# Patient Record
Sex: Male | Born: 1935 | Race: Black or African American | Hispanic: No | Smoking: Never smoker
Health system: Southern US, Community
[De-identification: ages and names within clinical notes are randomized; demographics above are authoritative.]

## PROBLEM LIST (undated history)

## (undated) DIAGNOSIS — I4891 Unspecified atrial fibrillation: Secondary | ICD-10-CM

## (undated) DIAGNOSIS — I10 Essential (primary) hypertension: Secondary | ICD-10-CM

## (undated) HISTORY — DX: Unspecified atrial fibrillation: I48.91

---

## 2004-07-10 ENCOUNTER — Emergency Department (HOSPITAL_COMMUNITY): Admission: EM | Admit: 2004-07-10 | Discharge: 2004-07-10 | Payer: Self-pay | Admitting: Emergency Medicine

## 2010-10-15 ENCOUNTER — Inpatient Hospital Stay (HOSPITAL_COMMUNITY)
Admission: EM | Admit: 2010-10-15 | Discharge: 2010-10-21 | Payer: Self-pay | Source: Home / Self Care | Attending: Internal Medicine | Admitting: Internal Medicine

## 2010-10-18 HISTORY — PX: PROSTATE SURGERY: SHX751

## 2010-11-05 ENCOUNTER — Encounter: Payer: Self-pay | Admitting: Urology

## 2010-11-05 ENCOUNTER — Ambulatory Visit (HOSPITAL_COMMUNITY)
Admission: RE | Admit: 2010-11-05 | Discharge: 2010-11-07 | Payer: Self-pay | Source: Home / Self Care | Attending: Urology | Admitting: Urology

## 2010-11-09 LAB — BASIC METABOLIC PANEL
BUN: 22 mg/dL (ref 6–23)
BUN: 14 mg/dL (ref 6–23)
CO2: 25 mEq/L (ref 19–32)
CO2: 25 mEq/L (ref 19–32)
Calcium: 8.8 mg/dL (ref 8.4–10.5)
Calcium: 8.5 mg/dL (ref 8.4–10.5)
Chloride: 107 mEq/L (ref 96–112)
Chloride: 107 mEq/L (ref 96–112)
Creatinine, Ser: 1.44 mg/dL (ref 0.4–1.5)
Creatinine, Ser: 1.18 mg/dL (ref 0.4–1.5)
GFR calc Af Amer: 55 mL/min — ABNORMAL LOW (ref >60)
GFR calc Af Amer: >60 mL/min (ref >60)
GFR calc non Af Amer: 45 mL/min — ABNORMAL LOW (ref >60)
GFR calc non Af Amer: 57 mL/min — ABNORMAL LOW (ref >60)
Glucose, Bld: 99 mg/dL (ref 70–99)
Glucose, Bld: 102 mg/dL — ABNORMAL HIGH (ref 70–99)
Potassium: 4.7 mEq/L (ref 3.5–5.1)
Potassium: 4.4 mEq/L (ref 3.5–5.1)
Sodium: 138 mEq/L (ref 135–145)
Sodium: 136 mEq/L (ref 135–145)

## 2010-11-09 LAB — CBC
HCT: 31.1 % — ABNORMAL LOW (ref 39.0–52.0)
HCT: 28.1 % — ABNORMAL LOW (ref 39.0–52.0)
Hemoglobin: 10.7 g/dL — ABNORMAL LOW (ref 13.0–17.0)
Hemoglobin: 9.7 g/dL — ABNORMAL LOW (ref 13.0–17.0)
MCH: 33.3 pg (ref 26.0–34.0)
MCH: 32.7 pg (ref 26.0–34.0)
MCHC: 34.4 g/dL (ref 30.0–36.0)
MCHC: 34.5 g/dL (ref 30.0–36.0)
MCV: 96.9 fL (ref 78.0–100.0)
MCV: 94.6 fL (ref 78.0–100.0)
Platelets: 165 10*3/uL (ref 150–400)
Platelets: 148 10*3/uL — ABNORMAL LOW (ref 150–400)
RBC: 3.21 MIL/uL — ABNORMAL LOW (ref 4.22–5.81)
RBC: 2.97 MIL/uL — ABNORMAL LOW (ref 4.22–5.81)
RDW: 13 % (ref 11.5–15.5)
RDW: 12.9 % (ref 11.5–15.5)
WBC: 5.4 10*3/uL (ref 4.0–10.5)
WBC: 5.5 10*3/uL (ref 4.0–10.5)

## 2010-11-09 LAB — SURGICAL PCR SCREEN
MRSA, PCR: NEGATIVE
Staphylococcus aureus: NEGATIVE

## 2010-11-09 NOTE — Op Note (Signed)
NAME:  KRUZ, CHIU NO.:  [id number]  MEDICAL RECORD NO.:  [id number]          PATIENT TYPE:  OIB  LOCATION:  0098                         FACILITY:  [facility]  PHYSICIAN:  Excell Seltzer. Annabell Howells, M.D.    DATE OF BIRTH:  Jul 25, 1913  DATE OF PROCEDURE: DATE OF DISCHARGE:                              OPERATIVE REPORT   PROCEDURE:  Transurethral section of the prostate.  PREOPERATIVE DIAGNOSES: 1. Benign prostatic hyperplasia. 2. Bladder outlet obstruction. 3. Hematuria.  POSTOPERATIVE DIAGNOSIS: 1. Benign prostatic hyperplasia. 2. Bladder outlet obstruction. 3. Hematuria.  SURGEON:  Excell Seltzer. Annabell Howells, M.D.  ANESTHESIA:  General.  SPECIMEN:  Prostate tissue.  DRAINS:  A 22-French three-way Foley catheter.  ESTIMATED BLOOD LOSS:  200 mL.  COMPLICATIONS:  None.  INDICATIONS:  Mr. William Nicholson is a 77 year old, African-American male with a history of a TURP 35 years ago.  He recently presented in acute retention with hematuria.  He underwent fulguration of bleeders at a prior hospitalization, but has continued to have recurrent hematuria and office cystoscopy suggested the prostate as the source, although because of the bleeding, it was difficult to say for certain, but it was felt that cystoscopy with TUR and fulguration was indicated.  FINDINGS AND PROCEDURE:  He had been on Levaquin preoperatively.  He was taken to the operating room where he was given Cipro and fitted with PAS hose.  A spinal anesthetic was induced, and he was placed in the lithotomy position.  His perineum and genitalia were prepped with Betadine solution.  He was draped in the usual sterile fashion. Cystoscopy was performed using a 22-French scope and 12-degree lens. Examination revealed a normal urethra.  The external sphincter was intact.  The prostatic urethra had lateral lobe enlargement with some apical scarring.  There was evidence of a TUR defect proximally. No middle lobe was noted.  The  bladder had moderate trabeculation with some wide-mouth diverticula.  No tumors or stones were noted, but there was some catheter irritation on the bladder wall.  The ureteral orifices were unremarkable.  A 28-French continuous flow resectoscope sheath was then inserted. Saline was used for the irrigant.  This was fitted with an [place] handle, a loop, electrode and the 12-degree lens.  Resection of the prostate was initiated on the floor of the prostate out and alongside the verumontanum.  The patient had brisk bleeding in many areas that required generous fulguration.  Once the floor identified, the right lateral lobe was resected from bladder neck to apex out the capsular fibers.  At one point during this resection, I encountered an arterial bleeder that could not be controlled with the loop, so the button electrode was placed, and this bleeder was successfully fulgurated.  The loop electrode was then replaced, and the resection of the right lobe was completed.  This was followed by resection of the left lobe from bladder neck to apex out to the capsular fibers.  A similar bleeder was encountered on the left that required the button electrode for fulguration.  Once the button electrode was in the second time, I used it to vaporize some residual apical  and anterior tissue and ensure good hemostasis by a generous fulguration of the resected areas.  At this point, the prostatic urethra was widely patent.  The bladder was evacuated free of chips, and final hemostasis was achieved.  The resectoscope was removed, and a 22-French three-way Foley catheter was inserted.  The balloon was filled with 30 mL of sterile fluid.  The catheter was hand-irrigated with clear return and placed on continuous irrigation and straight drainage.  The patient was taken down from the lithotomy position, and his anesthetic was reversed.  He was moved to the recovery room in stable condition.  There were no  complications.     Excell Seltzer. Annabell Howells, M.D.     JJW/MEDQ  D:  11/05/2010  T:  11/05/2010  Job:  [id number]  Electronically Signed by Bjorn Pippin M.D. on 11/09/2010 08:15:30 AM

## 2010-12-28 LAB — CBC
HCT: 21.4 % — ABNORMAL LOW (ref 39.0–52.0)
HCT: 22.3 % — ABNORMAL LOW (ref 39.0–52.0)
HCT: 22.5 % — ABNORMAL LOW (ref 39.0–52.0)
HCT: 25.4 % — ABNORMAL LOW (ref 39.0–52.0)
HCT: 26.8 % — ABNORMAL LOW (ref 39.0–52.0)
HCT: 27.3 % — ABNORMAL LOW (ref 39.0–52.0)
HCT: 33 % — ABNORMAL LOW (ref 39.0–52.0)
Hemoglobin: 11.5 g/dL — ABNORMAL LOW (ref 13.0–17.0)
Hemoglobin: 7.3 g/dL — ABNORMAL LOW (ref 13.0–17.0)
Hemoglobin: 7.6 g/dL — ABNORMAL LOW (ref 13.0–17.0)
Hemoglobin: 7.7 g/dL — ABNORMAL LOW (ref 13.0–17.0)
Hemoglobin: 8.9 g/dL — ABNORMAL LOW (ref 13.0–17.0)
MCH: 32.8 pg (ref 26.0–34.0)
MCH: 33.6 pg (ref 26.0–34.0)
MCH: 33.8 pg (ref 26.0–34.0)
MCH: 33.8 pg (ref 26.0–34.0)
MCH: 33.8 pg (ref 26.0–34.0)
MCH: 34.1 pg — ABNORMAL HIGH (ref 26.0–34.0)
MCH: 34.4 pg — ABNORMAL HIGH (ref 26.0–34.0)
MCHC: 34.1 g/dL (ref 30.0–36.0)
MCHC: 34.1 g/dL (ref 30.0–36.0)
MCHC: 34.2 g/dL (ref 30.0–36.0)
MCHC: 34.4 g/dL (ref 30.0–36.0)
MCHC: 34.8 g/dL (ref 30.0–36.0)
MCHC: 35 g/dL (ref 30.0–36.0)
MCV: 100 fL (ref 78.0–100.0)
MCV: 100.4 fL — ABNORMAL HIGH (ref 78.0–100.0)
MCV: 94.7 fL (ref 78.0–100.0)
MCV: 95.1 fL (ref 78.0–100.0)
MCV: 96.6 fL (ref 78.0–100.0)
MCV: 97.1 fL (ref 78.0–100.0)
MCV: 99.1 fL (ref 78.0–100.0)
Platelets: 107 10*3/uL — ABNORMAL LOW (ref 150–400)
Platelets: 113 10*3/uL — ABNORMAL LOW (ref 150–400)
Platelets: 121 10*3/uL — ABNORMAL LOW (ref 150–400)
Platelets: 121 10*3/uL — ABNORMAL LOW (ref 150–400)
Platelets: 124 10*3/uL — ABNORMAL LOW (ref 150–400)
Platelets: 134 10*3/uL — ABNORMAL LOW (ref 150–400)
Platelets: 162 10*3/uL (ref 150–400)
RBC: 2.14 MIL/uL — ABNORMAL LOW (ref 4.22–5.81)
RBC: 2.24 MIL/uL — ABNORMAL LOW (ref 4.22–5.81)
RBC: 2.25 MIL/uL — ABNORMAL LOW (ref 4.22–5.81)
RBC: 2.63 MIL/uL — ABNORMAL LOW (ref 4.22–5.81)
RBC: 2.83 MIL/uL — ABNORMAL LOW (ref 4.22–5.81)
RBC: 3.4 MIL/uL — ABNORMAL LOW (ref 4.22–5.81)
RDW: 11.7 % (ref 11.5–15.5)
RDW: 11.7 % (ref 11.5–15.5)
RDW: 12 % (ref 11.5–15.5)
RDW: 12 % (ref 11.5–15.5)
RDW: 12 % (ref 11.5–15.5)
RDW: 13.9 % (ref 11.5–15.5)
WBC: 5.4 10*3/uL (ref 4.0–10.5)
WBC: 5.9 10*3/uL (ref 4.0–10.5)
WBC: 7.2 10*3/uL (ref 4.0–10.5)
WBC: 7.5 10*3/uL (ref 4.0–10.5)
WBC: 8.6 10*3/uL (ref 4.0–10.5)
WBC: 9 10*3/uL (ref 4.0–10.5)
WBC: 9.6 10*3/uL (ref 4.0–10.5)

## 2010-12-28 LAB — URINALYSIS, ROUTINE W REFLEX MICROSCOPIC
Bilirubin Urine: NEGATIVE
Glucose, UA: 100 mg/dL — AB
Nitrite: POSITIVE — AB
Protein, ur: >300 mg/dL — AB
Specific Gravity, Urine: 1.021 (ref 1.005–1.030)
Urobilinogen, UA: 0.2 mg/dL (ref 0.0–1.0)
pH: 7 (ref 5.0–8.0)

## 2010-12-28 LAB — BASIC METABOLIC PANEL
BUN: 10 mg/dL (ref 6–23)
BUN: 15 mg/dL (ref 6–23)
BUN: 77 mg/dL — ABNORMAL HIGH (ref 6–23)
BUN: 98 mg/dL — ABNORMAL HIGH (ref 6–23)
CO2: 20 mEq/L (ref 19–32)
CO2: 22 mEq/L (ref 19–32)
CO2: 22 mEq/L (ref 19–32)
CO2: 22 mEq/L (ref 19–32)
CO2: 25 mEq/L (ref 19–32)
Calcium: 7.5 mg/dL — ABNORMAL LOW (ref 8.4–10.5)
Calcium: 8.1 mg/dL — ABNORMAL LOW (ref 8.4–10.5)
Calcium: 8.4 mg/dL (ref 8.4–10.5)
Chloride: 107 mEq/L (ref 96–112)
Chloride: 108 mEq/L (ref 96–112)
Chloride: 110 mEq/L (ref 96–112)
Chloride: 113 mEq/L — ABNORMAL HIGH (ref 96–112)
Chloride: 94 mEq/L — ABNORMAL LOW (ref 96–112)
Creatinine, Ser: 1.16 mg/dL (ref 0.4–1.5)
Creatinine, Ser: 1.3 mg/dL (ref 0.4–1.5)
Creatinine, Ser: 1.48 mg/dL (ref 0.4–1.5)
Creatinine, Ser: 11.68 mg/dL — ABNORMAL HIGH (ref 0.4–1.5)
Creatinine, Ser: 7.35 mg/dL — ABNORMAL HIGH (ref 0.4–1.5)
GFR calc Af Amer: 5 mL/min — ABNORMAL LOW (ref >60)
GFR calc Af Amer: 53 mL/min — ABNORMAL LOW (ref >60)
GFR calc Af Amer: 8 mL/min — ABNORMAL LOW (ref >60)
GFR calc Af Amer: >60 mL/min (ref >60)
GFR calc non Af Amer: 4 mL/min — ABNORMAL LOW (ref >60)
GFR calc non Af Amer: 44 mL/min — ABNORMAL LOW (ref >60)
GFR calc non Af Amer: 7 mL/min — ABNORMAL LOW (ref >60)
Glucose, Bld: 104 mg/dL — ABNORMAL HIGH (ref 70–99)
Glucose, Bld: 108 mg/dL — ABNORMAL HIGH (ref 70–99)
Glucose, Bld: 109 mg/dL — ABNORMAL HIGH (ref 70–99)
Glucose, Bld: 91 mg/dL (ref 70–99)
Potassium: 3.7 mEq/L (ref 3.5–5.1)
Potassium: 3.8 mEq/L (ref 3.5–5.1)
Potassium: 4 mEq/L (ref 3.5–5.1)
Potassium: 5.2 mEq/L — ABNORMAL HIGH (ref 3.5–5.1)
Potassium: 5.4 mEq/L — ABNORMAL HIGH (ref 3.5–5.1)
Sodium: 127 mEq/L — ABNORMAL LOW (ref 135–145)
Sodium: 139 mEq/L (ref 135–145)
Sodium: 139 mEq/L (ref 135–145)

## 2010-12-28 LAB — DIFFERENTIAL
Basophils Absolute: 0 10*3/uL (ref 0.0–0.1)
Basophils Relative: 0 % (ref 0–1)
Eosinophils Absolute: 0.2 10*3/uL (ref 0.0–0.7)
Eosinophils Relative: 4 % (ref 0–5)
Lymphocytes Relative: 18 % (ref 12–46)
Lymphs Abs: 1.1 10*3/uL (ref 0.7–4.0)
Monocytes Absolute: 0.8 10*3/uL (ref 0.1–1.0)
Monocytes Relative: 14 % — ABNORMAL HIGH (ref 3–12)
Neutro Abs: 3.8 10*3/uL (ref 1.7–7.7)
Neutrophils Relative %: 64 % (ref 43–77)

## 2010-12-28 LAB — RENAL FUNCTION PANEL
Albumin: 2.4 g/dL — ABNORMAL LOW (ref 3.5–5.2)
Albumin: 2.6 g/dL — ABNORMAL LOW (ref 3.5–5.2)
BUN: 29 mg/dL — ABNORMAL HIGH (ref 6–23)
BUN: 52 mg/dL — ABNORMAL HIGH (ref 6–23)
CO2: 22 mEq/L (ref 19–32)
CO2: 23 mEq/L (ref 19–32)
Calcium: 7.6 mg/dL — ABNORMAL LOW (ref 8.4–10.5)
Calcium: 7.8 mg/dL — ABNORMAL LOW (ref 8.4–10.5)
Chloride: 113 mEq/L — ABNORMAL HIGH (ref 96–112)
Chloride: 113 mEq/L — ABNORMAL HIGH (ref 96–112)
Creatinine, Ser: 2.44 mg/dL — ABNORMAL HIGH (ref 0.4–1.5)
Creatinine, Ser: 4.25 mg/dL — ABNORMAL HIGH (ref 0.4–1.5)
GFR calc Af Amer: 16 mL/min — ABNORMAL LOW (ref >60)
GFR calc Af Amer: 30 mL/min — ABNORMAL LOW (ref >60)
GFR calc non Af Amer: 13 mL/min — ABNORMAL LOW (ref >60)
GFR calc non Af Amer: 25 mL/min — ABNORMAL LOW (ref >60)
Glucose, Bld: 112 mg/dL — ABNORMAL HIGH (ref 70–99)
Glucose, Bld: 153 mg/dL — ABNORMAL HIGH (ref 70–99)
Phosphorus: 2.8 mg/dL (ref 2.3–4.6)
Phosphorus: 3.4 mg/dL (ref 2.3–4.6)
Potassium: 4 mEq/L (ref 3.5–5.1)
Potassium: 4.4 mEq/L (ref 3.5–5.1)
Sodium: 141 mEq/L (ref 135–145)
Sodium: 141 mEq/L (ref 135–145)

## 2010-12-28 LAB — CULTURE, BLOOD (ROUTINE X 2)
Culture  Setup Time: 201112301107
Culture  Setup Time: 201112301108
Culture: NO GROWTH
Culture: NO GROWTH

## 2010-12-28 LAB — CROSSMATCH
ABO/RH(D): O POS
Unit division: 0

## 2010-12-28 LAB — PSA: PSA: 38.26 ng/mL — ABNORMAL HIGH (ref <=4.00)

## 2010-12-28 LAB — URINE CULTURE
Colony Count: NO GROWTH
Culture  Setup Time: 201112300224
Culture: NO GROWTH

## 2010-12-28 LAB — URINE MICROSCOPIC-ADD ON

## 2010-12-28 LAB — ABO/RH: ABO/RH(D): O POS

## 2014-11-25 ENCOUNTER — Emergency Department (HOSPITAL_COMMUNITY)
Admission: EM | Admit: 2014-11-25 | Discharge: 2014-11-25 | Disposition: A | Discharge status: Home / Self Care | Payer: Commercial Managed Care - HMO | Attending: Emergency Medicine | Admitting: Emergency Medicine

## 2014-11-25 ENCOUNTER — Encounter (HOSPITAL_COMMUNITY): Payer: Self-pay

## 2014-11-25 ENCOUNTER — Emergency Department (HOSPITAL_COMMUNITY): Payer: Commercial Managed Care - HMO

## 2014-11-25 DIAGNOSIS — Y998 Other external cause status: Secondary | ICD-10-CM | POA: Insufficient documentation

## 2014-11-25 DIAGNOSIS — Y92008 Other place in unspecified non-institutional (private) residence as the place of occurrence of the external cause: Secondary | ICD-10-CM | POA: Diagnosis not present

## 2014-11-25 DIAGNOSIS — I10 Essential (primary) hypertension: Secondary | ICD-10-CM | POA: Diagnosis not present

## 2014-11-25 DIAGNOSIS — Y9389 Activity, other specified: Secondary | ICD-10-CM | POA: Diagnosis not present

## 2014-11-25 DIAGNOSIS — S8991XA Unspecified injury of right lower leg, initial encounter: Secondary | ICD-10-CM | POA: Diagnosis not present

## 2014-11-25 DIAGNOSIS — M1711 Unilateral primary osteoarthritis, right knee: Secondary | ICD-10-CM | POA: Diagnosis not present

## 2014-11-25 DIAGNOSIS — M25561 Pain in right knee: Secondary | ICD-10-CM | POA: Diagnosis not present

## 2014-11-25 HISTORY — DX: Essential (primary) hypertension: I10

## 2014-11-25 MED ORDER — ACETAMINOPHEN 325 MG PO TABS
650.0000 mg | ORAL_TABLET | Freq: Once | ORAL | Status: AC
  Administered 2014-11-25: 650 mg via ORAL
  Filled 2014-11-25: qty 2

## 2014-11-25 NOTE — Progress Notes (Signed)
CSW met with patient at bedside. Family was present. Family confirms that the patient presents to [facility] due to a fall that occurred yesterday. Family says that patient fell off of a chair. Daughter, states that she lives in the home with patient and the two stay in [place]. According to daughter, the patient has been able to complete his ADL's independently.  Daughter states that although the patient has been completing his ADL's independently she feels as though he has been moving slower than normal the last couple of weeks. Daughter states that she and patient do not want extra assistance at home during this time. She stated she wants to wait and see what a physician has to say and if patient will improve.   Family does not have any questions at this time.  Patient appears to be extremely hard of hearing.   Romie Minus Shaffner [phone] Omelia Blackwater [phone] ED CSW 11/25/2014 10:24 PM

## 2014-11-25 NOTE — ED Notes (Signed)
Family states that patient slid out of a chair that he gets around in onto his bottom, pt only complains of right knee pain

## 2014-11-25 NOTE — Discharge Instructions (Signed)
Arthralgia Arthralgia is joint pain. A joint is a place where two bones meet. Joint pain can happen for many reasons. The joint can be bruised, stiff, infected, or weak from aging. Pain usually goes away after resting and taking medicine for soreness.  HOME CARE  Rest the joint as told by your doctor.  Keep the sore joint raised (elevated) for the first 24 hours.  Put ice on the joint area.  Put ice in a plastic bag.  Place a towel between your skin and the bag.  Leave the ice on for 15-20 minutes, 03-04 times a day.  Wear your splint, casting, elastic bandage, or sling as told by your doctor.  Only take medicine as told by your doctor. Do not take aspirin.  Use crutches as told by your doctor. Do not put weight on the joint until told to by your doctor. GET HELP RIGHT AWAY IF:   You have bruising, puffiness (swelling), or more pain.  Your fingers or toes turn blue or start to lose feeling (numb).  Your medicine does not lessen the pain.  Your pain becomes severe.  You have a temperature by mouth above 102 F (38.9 C), not controlled by medicine.  You cannot move or use the joint. MAKE SURE YOU:   Understand these instructions.  Will watch your condition.  Will get help right away if you are not doing well or get worse. Document Released: 09/22/2009 Document Revised: 12/27/2011 Document Reviewed: 09/22/2009 [facility] Patient Information 2015 [employer] [place]. This information is not intended to replace advice given to you by your health care provider. Make sure you discuss any questions you have with your health care provider. You can safely give your dad Tylenol or Advil for discomfort.  He can wear the knee sleeve for comfort for the next several days.  He should rest as much as possible for the next one to 2 days, but still get up and ambulate to the restroom with assistance.  Then slowly as he resume normal activities

## 2014-11-25 NOTE — ED Provider Notes (Signed)
CSN: [id number]     Arrival date & time 11/25/14  2038 History  This chart was scribed for non-physician practitioner, Sabino Dick, NP-C working with Purvis Sheffield, MD by Luisa Dago, ED scribe. This patient was seen in room WTR7/WTR7 and the patient's care was started at 9:54 PM.    Chief Complaint  Patient presents with  . Knee Pain   The history is provided by the patient and medical records. No language interpreter was used.   HPI Comments: William Nicholson is a 81 y.o. male with PMhx of HTN presents to the Emergency Department complaining of a fall that occurred last night. Family states that they heard the pt fall to the floor from motor chair, but the incident was not witnessed by them. Daughter states that she noticed that the pt was moving slow today, however, after he woke up from his nap he had a hard time walking to the bathroom. She states that she had to run and catch him to sit him in his chair. After asking him if he was in any pain, pt admitted to having pain in his right knee. No medicine was given to the pt for pain. Denies any chest pain or back pain.   Past Medical History  Diagnosis Date  . Hypertension    History reviewed. No pertinent past surgical history. History reviewed. No pertinent family history. History  Substance Use Topics  . Smoking status: Never Smoker   . Smokeless tobacco: Not on file  . Alcohol Use: No    Review of Systems  Constitutional: Negative for fever.  Musculoskeletal: Positive for arthralgias. Negative for myalgias and joint swelling.  Neurological: Negative for weakness and numbness.   Allergies  Review of patient's allergies indicates no known allergies.  Home Medications   Prior to Admission medications   Medication Sig Start Date End Date Taking? Authorizing Provider  metoprolol succinate (TOPROL-XL) 100 MG 24 hr tablet Take 100 mg by mouth daily. 11/09/14  Yes Historical Provider, MD  Rivaroxaban (XARELTO) 15 MG TABS tablet  Take 15 mg by mouth daily.   Yes Historical Provider, MD   BP 116/80 mmHg  Pulse 99  Temp(Src) 97.9 F (36.6 C) (Oral)  Resp 16  SpO2 97%  Physical Exam  Constitutional: He is oriented to person, place, and time. He appears well-developed and well-nourished.  HENT:  Head: Normocephalic and atraumatic.  Cardiovascular: Normal rate.   Pulmonary/Chest: Effort normal.  Abdominal: He exhibits no distension.  Neurological: He is alert and oriented to person, place, and time.  Skin: Skin is warm and dry.  Psychiatric: He has a normal mood and affect.  Nursing note and vitals reviewed.   ED Course  Procedures (including critical care time)  DIAGNOSTIC STUDIES: Oxygen Saturation is 97% on RA, adequate by my interpretation.    COORDINATION OF CARE: 10:00 PM- Pt advised of plan for treatment and pt agrees.  Labs Review Labs Reviewed - No data to display  Imaging Review Dg Knee Complete 4 Views Right  11/25/2014   CLINICAL DATA:  Patient's lead off of the tear yesterday. Complaining of right knee pain. Difficulty weight-bearing.  EXAM: RIGHT KNEE - COMPLETE 4+ VIEW  COMPARISON:  None.  FINDINGS: Degenerative changes in the right knee with compartment narrowing and osteophytosis greatest in the lateral compartment. No evidence of acute fracture or subluxation. No focal bone lesion or bone destruction. Bone cortex and trabecular architecture appear intact. No significant effusion. No radiopaque soft tissue foreign bodies. Vascular  calcifications.  IMPRESSION: Degenerative changes in the right knee. No acute bony abnormalities.   Electronically Signed   By: Burman NievesWilliam  Stevens M.D.   On: 11/25/2014 21:53     EKG Interpretation None     X-ray shows mild degenerative changes but no fracture or subluxation.  Patient has been given Tylenol for his discomfort, as well as placed in a knee sleeve.  He's been instructed to rest as much as possible for the next several days and then resume normal  activities MDM   Final diagnoses:  Knee pain, acute, right   I personally performed the services described in this documentation, which was scribed in my presence. The recorded information has been reviewed and is accurate.   Arman FilterGail K Terrisha Lopata, NP 11/25/14 [id number]  Purvis SheffieldForrest Harrison, MD 11/26/14 [id number]

## 2014-12-17 DIAGNOSIS — S99921A Unspecified injury of right foot, initial encounter: Secondary | ICD-10-CM | POA: Diagnosis not present

## 2014-12-17 DIAGNOSIS — I10 Essential (primary) hypertension: Secondary | ICD-10-CM | POA: Diagnosis not present

## 2014-12-17 DIAGNOSIS — I482 Chronic atrial fibrillation: Secondary | ICD-10-CM | POA: Diagnosis not present

## 2014-12-17 DIAGNOSIS — M79671 Pain in right foot: Secondary | ICD-10-CM | POA: Diagnosis not present

## 2014-12-17 DIAGNOSIS — R600 Localized edema: Secondary | ICD-10-CM | POA: Diagnosis not present

## 2014-12-24 DIAGNOSIS — I10 Essential (primary) hypertension: Secondary | ICD-10-CM | POA: Diagnosis not present

## 2014-12-24 DIAGNOSIS — I482 Chronic atrial fibrillation: Secondary | ICD-10-CM | POA: Diagnosis not present

## 2014-12-24 DIAGNOSIS — R600 Localized edema: Secondary | ICD-10-CM | POA: Diagnosis not present

## 2015-01-03 ENCOUNTER — Ambulatory Visit (INDEPENDENT_AMBULATORY_CARE_PROVIDER_SITE_OTHER): Payer: Commercial Managed Care - HMO

## 2015-01-03 ENCOUNTER — Ambulatory Visit (INDEPENDENT_AMBULATORY_CARE_PROVIDER_SITE_OTHER): Payer: Commercial Managed Care - HMO | Admitting: Podiatry

## 2015-01-03 ENCOUNTER — Encounter: Payer: Self-pay | Admitting: Podiatry

## 2015-01-03 VITALS — BP 152/93 | HR 82 | Resp 15

## 2015-01-03 DIAGNOSIS — M79671 Pain in right foot: Secondary | ICD-10-CM

## 2015-01-03 DIAGNOSIS — B351 Tinea unguium: Secondary | ICD-10-CM

## 2015-01-03 DIAGNOSIS — M79676 Pain in unspecified toe(s): Secondary | ICD-10-CM | POA: Diagnosis not present

## 2015-01-03 DIAGNOSIS — R609 Edema, unspecified: Secondary | ICD-10-CM

## 2015-01-03 NOTE — Progress Notes (Signed)
   Subjective:    Patient ID: William Nicholson, male    DOB: 1913/09/28, 81 y.o.   MRN: [id number]  HPI 81 year old male presents the office today with family member with complaints of thick, discolored, elongated toenails which she is unable to trim himself. They deny any recent redness or drainage from the nail sites. Then a number also states that he's had some swelling to his feet particularly his right foot. He does have a history of injury to the right foot several weeks ago for which he has seen another physician which x-rays were obtained and he was told no fracture was present. His diuretic has been increased. No other complaints at this time.   Review of Systems  All other systems reviewed and are negative.      Objective:   Physical Exam Awake, alert, NAD DP/PT pulses decreased bilaterally, CRT less than 3 seconds Achilles tendon reflex intact. Difficult to evaluate protective sensation vibratory sensation Nails are hypertrophic, dystrophic, elongated, brittle, discolored 10. No surrounding erythema or drainage. There subjective tenderness on nails 1-5 bilaterally. No open lesions or pre-ulcerative lesions are identified bilaterally. No interdigital maceration. There is mild edema to the right dorsal foot. There is not appear to be any area pinpoint bony tenderness or pain with vibratory sensation. There is no overlying erythema or increase in warmth. There is no apparent discomfort ankles, subtalar, MTPJ, midfoot joint range of motion. No pain with calf compression, swelling, warmth, erythema.       Assessment & Plan:  81 year old male presents for symptomatically onychomycosis, right foot swelling -X-rays were obtained and discussed with the patient/family number. There is no evidence of degenerative fracture at this time. Continue follow-up with primary care physician. -Nail sharply debrided 10 without complication/bleeding. -Discussed importance of daily foot  inspection. -Follow-up in 3 months or sooner if needed.

## 2015-01-07 ENCOUNTER — Encounter: Payer: Self-pay | Admitting: Podiatry

## 2015-02-19 DIAGNOSIS — N401 Enlarged prostate with lower urinary tract symptoms: Secondary | ICD-10-CM | POA: Diagnosis not present

## 2015-02-19 DIAGNOSIS — N138 Other obstructive and reflux uropathy: Secondary | ICD-10-CM | POA: Diagnosis not present

## 2015-02-19 DIAGNOSIS — N390 Urinary tract infection, site not specified: Secondary | ICD-10-CM | POA: Diagnosis not present

## 2015-02-27 DIAGNOSIS — R310 Gross hematuria: Secondary | ICD-10-CM | POA: Diagnosis not present

## 2015-02-27 DIAGNOSIS — N390 Urinary tract infection, site not specified: Secondary | ICD-10-CM | POA: Diagnosis not present

## 2015-03-11 DIAGNOSIS — I10 Essential (primary) hypertension: Secondary | ICD-10-CM | POA: Diagnosis not present

## 2015-03-11 DIAGNOSIS — I482 Chronic atrial fibrillation: Secondary | ICD-10-CM | POA: Diagnosis not present

## 2015-03-18 DIAGNOSIS — I10 Essential (primary) hypertension: Secondary | ICD-10-CM | POA: Diagnosis not present

## 2015-03-18 DIAGNOSIS — D696 Thrombocytopenia, unspecified: Secondary | ICD-10-CM | POA: Diagnosis not present

## 2015-03-18 DIAGNOSIS — I482 Chronic atrial fibrillation: Secondary | ICD-10-CM | POA: Diagnosis not present

## 2015-04-04 ENCOUNTER — Ambulatory Visit (INDEPENDENT_AMBULATORY_CARE_PROVIDER_SITE_OTHER): Payer: Commercial Managed Care - HMO | Admitting: Podiatry

## 2015-04-04 DIAGNOSIS — B351 Tinea unguium: Secondary | ICD-10-CM

## 2015-04-04 DIAGNOSIS — M79676 Pain in unspecified toe(s): Secondary | ICD-10-CM

## 2015-04-04 DIAGNOSIS — L84 Corns and callosities: Secondary | ICD-10-CM

## 2015-04-12 NOTE — Progress Notes (Signed)
Patient ID: William Nicholson, male   DOB: [birth year], 81 y.o.   MRN: [id number]  Subjective: 81 y.o.-year-old male returns the office today for painful, elongated, thickened toenails which he is unable to trim himself. Denies any redness or drainage around the nails. Denies any acute changes since last appointment and no new complaints today. Denies any systemic complaints such as fevers, chills, nausea, vomiting.   Objective: AAO 3, NAD DP/PT pulses decreased, CRT less than 3 seconds Achilles tendon reflex intact.  Nails hypertrophic, dystrophic, elongated, brittle, discolored 10. There is tenderness overlying the nails 1-5 bilatereally. There is no surrounding erythema or drainage along the nail sites.  on the lateral aspect of the right fifth metatarsal had there is a small, dried hemorrhagic bulla area. There is no swelling erythema, ascending cellulitis, crepitus, drainage, malodor. No other open lesions or pre-ulcerative lesions are identified. No other areas of tenderness bilateral lower extremities. No overlying edema, erythema, increased warmth. No pain with calf compression, swelling, warmth, erythema.  Assessment: Patient presents with symptomatic onychomycosis; pre-ulcerative site right lateral foot.   Plan: -Treatment options including alternatives, risks, complications were discussed -Nails sharply debrided 10 without complication/bleeding. - continue to monitor area on the right lateral foot. Recommend offloading past the area which were dispensed. Monitor for any skin breakdown. There is not healed within 2 weeks to call the office for follow-up appointment or sooner if any problems are to arise. -Discussed daily foot inspection. If there are any changes, to call the office immediately.  -Follow-up in 3 months or sooner if any problems are to arise. In the meantime, encouraged to call the office with any questions, concerns, changes symptoms.  [name], DPM

## 2015-07-18 ENCOUNTER — Ambulatory Visit (INDEPENDENT_AMBULATORY_CARE_PROVIDER_SITE_OTHER): Payer: Commercial Managed Care - HMO | Admitting: Podiatry

## 2015-07-18 ENCOUNTER — Encounter: Payer: Self-pay | Admitting: Podiatry

## 2015-07-18 DIAGNOSIS — B351 Tinea unguium: Secondary | ICD-10-CM | POA: Diagnosis not present

## 2015-07-18 DIAGNOSIS — M79676 Pain in unspecified toe(s): Secondary | ICD-10-CM | POA: Diagnosis not present

## 2015-07-18 NOTE — Progress Notes (Signed)
Patient ID: William Nicholson, male   DOB: Jan 30, 1913, 81 y.o.   MRN: [id number]  Subjective: 81 y.o.-year-old male returns the office today for painful, elongated, thickened toenails which he is unable to trim himself. Denies any redness or drainage around the nails. Denies any acute changes since last appointment and no new complaints today. Denies any systemic complaints such as fevers, chills, nausea, vomiting.   Objective: Awake, NAD; presents with family member  DP/PT pulses decreased, CRT less than 3 seconds Nails hypertrophic, dystrophic, elongated, brittle, discolored 10. There is apparent tenderness overlying the nails 1-5 bilatereally. There is no surrounding erythema or drainage along the nail sites. No open lesions or pre-ulcerative lesions are identified. No other areas of tenderness bilateral lower extremities. No overlying edema, erythema, increased warmth. No pain with calf compression, swelling, warmth, erythema.  Assessment: Patient presents with symptomatic onychomycosis   Plan: -Treatment options including alternatives, risks, complications were discussed -Nails sharply debrided 10 without complication/bleeding. -Discussed daily foot inspection. If there are any changes, to call the office immediately.  -Follow-up in 3 months or sooner if any problems are to arise. In the meantime, encouraged to call the office with any questions, concerns, changes symptoms.  [name], DPM

## 2015-07-23 DIAGNOSIS — Z23 Encounter for immunization: Secondary | ICD-10-CM | POA: Diagnosis not present

## 2015-07-23 DIAGNOSIS — T148 Other injury of unspecified body region: Secondary | ICD-10-CM | POA: Diagnosis not present

## 2015-08-20 DIAGNOSIS — I482 Chronic atrial fibrillation: Secondary | ICD-10-CM | POA: Diagnosis not present

## 2015-08-20 DIAGNOSIS — D696 Thrombocytopenia, unspecified: Secondary | ICD-10-CM | POA: Diagnosis not present

## 2015-08-20 DIAGNOSIS — I10 Essential (primary) hypertension: Secondary | ICD-10-CM | POA: Diagnosis not present

## 2015-08-27 DIAGNOSIS — I482 Chronic atrial fibrillation: Secondary | ICD-10-CM | POA: Diagnosis not present

## 2015-08-27 DIAGNOSIS — I10 Essential (primary) hypertension: Secondary | ICD-10-CM | POA: Diagnosis not present

## 2015-08-27 DIAGNOSIS — D696 Thrombocytopenia, unspecified: Secondary | ICD-10-CM | POA: Diagnosis not present

## 2015-10-28 ENCOUNTER — Ambulatory Visit: Payer: Commercial Managed Care - HMO | Admitting: Podiatry

## 2015-11-11 ENCOUNTER — Encounter: Payer: Self-pay | Admitting: Podiatry

## 2015-11-11 ENCOUNTER — Ambulatory Visit (INDEPENDENT_AMBULATORY_CARE_PROVIDER_SITE_OTHER): Payer: Commercial Managed Care - HMO | Admitting: Podiatry

## 2015-11-11 DIAGNOSIS — B351 Tinea unguium: Secondary | ICD-10-CM | POA: Diagnosis not present

## 2015-11-11 DIAGNOSIS — M79676 Pain in unspecified toe(s): Secondary | ICD-10-CM | POA: Diagnosis not present

## 2015-11-11 NOTE — Progress Notes (Signed)
Patient ID: William Nicholson, male   DOB: [birth year], 82 y.o.   MRN: [id number] HPI  Complaint:  Visit Type: Patient returns to my office for continued preventative foot care services. Complaint: Patient states" my nails have grown long and thick and become painful to walk and wear shoes"  . This patient  presents for preventative foot care services. No changes to ROS  Podiatric Exam: Vascular: dorsalis pedis and posterior tibial pulses are non-palpable. Capillary return is diminished. Cold feet noted.. Skin turgor WNL, bilateral swelling  Sensorium: Normal Semmes Weinstein monofilament test. Normal tactile sensation bilaterally.  Nail Exam: Pt has thick disfigured discolored nails with subungual debris noted bilateral entire nail hallux through fifth toenails Ulcer Exam: There is no evidence of ulcer or pre-ulcerative changes or infection. Orthopedic Exam: Muscle tone and strength are WNL. No limitations in general ROM. No crepitus or effusions noted. Foot type and digits show no abnormalities. Bony prominences are unremarkable. Skin: No Porokeratosis. No infection or ulcers  Diagnosis:  Onychomycosis, Pain in right toe, pain in left toes  Treatment & Plan Procedures and Treatment: Consent by patient was obtained for treatment procedures. The patient understood the discussion of treatment and procedures well. All questions were answered thoroughly reviewed. Debridement of mycotic and hypertrophic toenails, 1 through 5 bilateral and clearing of subungual debris. No ulceration, no infection noted.  Return Visit-Office Procedure: Patient instructed to return to the office for a follow up visit 3 months for continued evaluation and treatment.    Helane Gunther DPM

## 2015-12-01 DIAGNOSIS — E538 Deficiency of other specified B group vitamins: Secondary | ICD-10-CM | POA: Diagnosis not present

## 2015-12-01 DIAGNOSIS — R410 Disorientation, unspecified: Secondary | ICD-10-CM | POA: Diagnosis not present

## 2015-12-01 DIAGNOSIS — N390 Urinary tract infection, site not specified: Secondary | ICD-10-CM | POA: Diagnosis not present

## 2015-12-14 ENCOUNTER — Emergency Department (HOSPITAL_COMMUNITY)
Admission: EM | Admit: 2015-12-14 | Discharge: 2015-12-14 | Disposition: A | Discharge status: Home / Self Care | Payer: Commercial Managed Care - HMO | Attending: Emergency Medicine | Admitting: Emergency Medicine

## 2015-12-14 ENCOUNTER — Encounter (HOSPITAL_COMMUNITY): Payer: Self-pay | Admitting: Emergency Medicine

## 2015-12-14 ENCOUNTER — Emergency Department (HOSPITAL_COMMUNITY): Payer: Commercial Managed Care - HMO

## 2015-12-14 DIAGNOSIS — R319 Hematuria, unspecified: Secondary | ICD-10-CM

## 2015-12-14 DIAGNOSIS — R509 Fever, unspecified: Secondary | ICD-10-CM | POA: Diagnosis not present

## 2015-12-14 DIAGNOSIS — Q549 Hypospadias, unspecified: Secondary | ICD-10-CM | POA: Insufficient documentation

## 2015-12-14 DIAGNOSIS — I10 Essential (primary) hypertension: Secondary | ICD-10-CM | POA: Insufficient documentation

## 2015-12-14 DIAGNOSIS — Z7901 Long term (current) use of anticoagulants: Secondary | ICD-10-CM | POA: Diagnosis not present

## 2015-12-14 DIAGNOSIS — Z4901 Encounter for fitting and adjustment of extracorporeal dialysis catheter: Secondary | ICD-10-CM | POA: Diagnosis not present

## 2015-12-14 DIAGNOSIS — Z79899 Other long term (current) drug therapy: Secondary | ICD-10-CM | POA: Insufficient documentation

## 2015-12-14 DIAGNOSIS — R369 Urethral discharge, unspecified: Secondary | ICD-10-CM | POA: Diagnosis present

## 2015-12-14 DIAGNOSIS — N3001 Acute cystitis with hematuria: Secondary | ICD-10-CM | POA: Insufficient documentation

## 2015-12-14 LAB — CBC WITH DIFFERENTIAL/PLATELET
Basophils Absolute: 0 10*3/uL (ref 0.0–0.1)
Basophils Relative: 0 %
EOS PCT: 5 %
Eosinophils Absolute: 0.2 10*3/uL (ref 0.0–0.7)
HCT: 35.8 % — ABNORMAL LOW (ref 39.0–52.0)
Hemoglobin: 11.9 g/dL — ABNORMAL LOW (ref 13.0–17.0)
LYMPHS ABS: 1 10*3/uL (ref 0.7–4.0)
LYMPHS PCT: 21 %
MCH: 34.3 pg — AB (ref 26.0–34.0)
MCHC: 33.2 g/dL (ref 30.0–36.0)
MCV: 103.2 fL — AB (ref 78.0–100.0)
MONO ABS: 0.6 10*3/uL (ref 0.1–1.0)
MONOS PCT: 12 %
Neutro Abs: 3 10*3/uL (ref 1.7–7.7)
Neutrophils Relative %: 62 %
PLATELETS: 128 10*3/uL — AB (ref 150–400)
RBC: 3.47 MIL/uL — AB (ref 4.22–5.81)
RDW: 12.5 % (ref 11.5–15.5)
WBC: 4.8 10*3/uL (ref 4.0–10.5)

## 2015-12-14 LAB — URINALYSIS, ROUTINE W REFLEX MICROSCOPIC
GLUCOSE, UA: NEGATIVE mg/dL
Ketones, ur: 15 mg/dL — AB
Nitrite: NEGATIVE
PROTEIN: 30 mg/dL — AB
Specific Gravity, Urine: 1.012 (ref 1.005–1.030)
pH: 7.5 (ref 5.0–8.0)

## 2015-12-14 LAB — URINE MICROSCOPIC-ADD ON

## 2015-12-14 LAB — BASIC METABOLIC PANEL
Anion gap: 5 (ref 5–15)
BUN: 32 mg/dL — AB (ref 6–20)
CHLORIDE: 108 mmol/L (ref 101–111)
CO2: 26 mmol/L (ref 22–32)
Calcium: 9 mg/dL (ref 8.9–10.3)
Creatinine, Ser: 1.09 mg/dL (ref 0.61–1.24)
GFR calc Af Amer: >60 mL/min (ref >60)
GFR calc non Af Amer: 53 mL/min — ABNORMAL LOW (ref >60)
GLUCOSE: 90 mg/dL (ref 65–99)
POTASSIUM: 4.5 mmol/L (ref 3.5–5.1)
Sodium: 139 mmol/L (ref 135–145)

## 2015-12-14 MED ORDER — LEVOFLOXACIN 750 MG PO TABS: 750.0000 mg | ORAL_TABLET | Freq: Every day | ORAL | Status: DC

## 2015-12-14 NOTE — ED Notes (Signed)
Bed: WA04 Expected date:  Expected time:  Means of arrival:  Comments: EMS- Penile bleeding

## 2015-12-14 NOTE — ED Notes (Signed)
Pt's family noticed blood and clots from penis and in diaper this morning.

## 2015-12-14 NOTE — ED Provider Notes (Signed)
CSN: [id number]     Arrival date & time 12/14/15  1021 History   First MD Initiated Contact with Patient 12/14/15 1027     Chief Complaint  Patient presents with  . Penile Discharge    bleeding and clots noticed from penis     (Consider location/radiation/quality/duration/timing/severity/associated sxs/prior Treatment) HPI Comments: Patient is a 82 year old male who presents with complaints of bleeding from his penis. The family members changed his diaper this morning and noticed there was blood and clots present. The patient has a history of dementia and is very hard of hearing and adds little additional information.  Patient is a 82 y.o. male presenting with penile discharge. The history is provided by the patient.  Penile Discharge The current episode started 1 to 2 hours ago. The problem occurs constantly. The problem has not changed since onset.Nothing aggravates the symptoms. Nothing relieves the symptoms. He has tried nothing for the symptoms. The treatment provided no relief.    Past Medical History  Diagnosis Date  . Hypertension    History reviewed. No pertinent past surgical history. History reviewed. No pertinent family history. Social History  Substance Use Topics  . Smoking status: Never Smoker   . Smokeless tobacco: None  . Alcohol Use: No    Review of Systems  Genitourinary: Positive for discharge.  All other systems reviewed and are negative.     Allergies  Review of patient's allergies indicates no known allergies.  Home Medications   Prior to Admission medications   Medication Sig Start Date End Date Taking? Authorizing Provider  metoprolol succinate (TOPROL-XL) 100 MG 24 hr tablet Take 100 mg by mouth daily. 11/09/14   Historical Provider, MD  Rivaroxaban (XARELTO) 15 MG TABS tablet Take 15 mg by mouth daily.    Historical Provider, MD   BP 125/94 mmHg  Pulse 80  Resp 18  SpO2 99% Physical Exam  Constitutional: He is oriented to person, place,  and time. He appears well-developed and well-nourished. No distress.  HENT:  Head: Normocephalic and atraumatic.  Mouth/Throat: Oropharynx is clear and moist.  Neck: Normal range of motion. Neck supple.  Cardiovascular: Normal rate and regular rhythm.   Pulmonary/Chest: Effort normal.  Abdominal: Soft. Bowel sounds are normal. He exhibits no distension. There is no tenderness.  Genitourinary:  There is reddish urine and clots present in the diaper. Hypospadias is noted, however the external genitalia otherwise appears normal.  Musculoskeletal: Normal range of motion. He exhibits no edema.  Neurological: He is alert and oriented to person, place, and time. Coordination normal.  Skin: Skin is warm and dry. He is not diaphoretic.  Nursing note and vitals reviewed.   ED Course  Procedures (including critical care time) Labs Review Labs Reviewed  BASIC METABOLIC PANEL  CBC WITH DIFFERENTIAL/PLATELET  URINALYSIS, ROUTINE W REFLEX MICROSCOPIC (NOT AT [facility])    Imaging Review No results found. I have personally reviewed and evaluated these images and lab results as part of my medical decision-making.   MDM   Final diagnoses:  Hematuria    Patient is a 82 year old male who presents with hematuria. He was recently treated for urinary tract infection, however this appears to be back. He does have findings consistent with a UTI. His blood counts and electrolytes are otherwise unremarkable. CT scan shows no obvious lesions within the urinary collecting system, however there is an area of increased density along the posterior right bladder which may represent blood or possibly a lesion. This patient is on  Xarelto for A. fib. It is my advice that he stopped taking this medication. I will also add an antibiotic. They are to follow-up with urology in the next 2-3 days. The patient has been seen by Dr. Brett Fairy in the past.    Geoffery Lyons, MD 12/14/15 [id number]

## 2015-12-14 NOTE — ED Notes (Signed)
In and out cath performed on patient, bloody urine, approximately 300 ml output. Pt's bladder scan post I&O cath = 100 ml. MD aware.

## 2015-12-14 NOTE — Discharge Instructions (Signed)
Start taking Levaquin as prescribed.  Stop taking your Xarelto until restarted by your primary doctor.  Follow-up with Dr. Annabell Howells in the urology clinic in the next 2-3 days for a recheck, and return to the ER if symptoms significantly worsen or change.   Urinary Tract Infection Urinary tract infections (UTIs) can develop anywhere along your urinary tract. Your urinary tract is your body's drainage system for removing wastes and extra water. Your urinary tract includes two kidneys, two ureters, a bladder, and a urethra. Your kidneys are a pair of bean-shaped organs. Each kidney is about the size of your fist. They are located below your ribs, one on each side of your spine. CAUSES Infections are caused by microbes, which are microscopic organisms, including fungi, viruses, and bacteria. These organisms are so small that they can only be seen through a microscope. Bacteria are the microbes that most commonly cause UTIs. SYMPTOMS  Symptoms of UTIs may vary by age and gender of the patient and by the location of the infection. Symptoms in young women typically include a frequent and intense urge to urinate and a painful, burning feeling in the bladder or urethra during urination. Older women and men are more likely to be tired, shaky, and weak and have muscle aches and abdominal pain. A fever may mean the infection is in your kidneys. Other symptoms of a kidney infection include pain in your back or sides below the ribs, nausea, and vomiting. DIAGNOSIS To diagnose a UTI, your caregiver will ask you about your symptoms. Your caregiver will also ask you to provide a urine sample. The urine sample will be tested for bacteria and white blood cells. White blood cells are made by your body to help fight infection. TREATMENT  Typically, UTIs can be treated with medication. Because most UTIs are caused by a bacterial infection, they usually can be treated with the use of antibiotics. The choice of antibiotic and  length of treatment depend on your symptoms and the type of bacteria causing your infection. HOME CARE INSTRUCTIONS  If you were prescribed antibiotics, take them exactly as your caregiver instructs you. Finish the medication even if you feel better after you have only taken some of the medication.  Drink enough water and fluids to keep your urine clear or pale yellow.  Avoid caffeine, tea, and carbonated beverages. They tend to irritate your bladder.  Empty your bladder often. Avoid holding urine for long periods of time.  Empty your bladder before and after sexual intercourse.  After a bowel movement, women should cleanse from front to back. Use each tissue only once. SEEK MEDICAL CARE IF:   You have back pain.  You develop a fever.  Your symptoms do not begin to resolve within 3 days. SEEK IMMEDIATE MEDICAL CARE IF:   You have severe back pain or lower abdominal pain.  You develop chills.  You have nausea or vomiting.  You have continued burning or discomfort with urination. MAKE SURE YOU:   Understand these instructions.  Will watch your condition.  Will get help right away if you are not doing well or get worse.   This information is not intended to replace advice given to you by your health care provider. Make sure you discuss any questions you have with your health care provider.   Document Released: 07/14/2005 Document Revised: 06/25/2015 Document Reviewed: 11/12/2011 Elsevier Interactive Patient Education [employer]

## 2015-12-16 ENCOUNTER — Encounter (HOSPITAL_COMMUNITY): Payer: Self-pay | Admitting: Emergency Medicine

## 2015-12-16 ENCOUNTER — Emergency Department (HOSPITAL_COMMUNITY)
Admission: EM | Admit: 2015-12-16 | Discharge: 2015-12-16 | Disposition: A | Discharge status: Home / Self Care | Payer: Commercial Managed Care - HMO | Attending: Emergency Medicine | Admitting: Emergency Medicine

## 2015-12-16 DIAGNOSIS — I959 Hypotension, unspecified: Secondary | ICD-10-CM | POA: Diagnosis present

## 2015-12-16 DIAGNOSIS — Z79899 Other long term (current) drug therapy: Secondary | ICD-10-CM | POA: Diagnosis not present

## 2015-12-16 DIAGNOSIS — R310 Gross hematuria: Secondary | ICD-10-CM | POA: Diagnosis not present

## 2015-12-16 DIAGNOSIS — I9589 Other hypotension: Secondary | ICD-10-CM

## 2015-12-16 DIAGNOSIS — I10 Essential (primary) hypertension: Secondary | ICD-10-CM | POA: Insufficient documentation

## 2015-12-16 DIAGNOSIS — Z0000 Encounter for general adult medical examination without abnormal findings: Secondary | ICD-10-CM | POA: Diagnosis not present

## 2015-12-16 DIAGNOSIS — B9620 Unspecified Escherichia coli [E. coli] as the cause of diseases classified elsewhere: Secondary | ICD-10-CM | POA: Diagnosis not present

## 2015-12-16 DIAGNOSIS — N390 Urinary tract infection, site not specified: Secondary | ICD-10-CM | POA: Diagnosis not present

## 2015-12-16 LAB — CBC WITH DIFFERENTIAL/PLATELET
BASOS ABS: 0 10*3/uL (ref 0.0–0.1)
BASOS PCT: 0 %
EOS ABS: 0.3 10*3/uL (ref 0.0–0.7)
Eosinophils Relative: 5 %
HEMATOCRIT: 36.7 % — AB (ref 39.0–52.0)
HEMOGLOBIN: 12.2 g/dL — AB (ref 13.0–17.0)
Lymphocytes Relative: 19 %
Lymphs Abs: 1.1 10*3/uL (ref 0.7–4.0)
MCH: 34.3 pg — ABNORMAL HIGH (ref 26.0–34.0)
MCHC: 33.2 g/dL (ref 30.0–36.0)
MCV: 103.1 fL — ABNORMAL HIGH (ref 78.0–100.0)
Monocytes Absolute: 0.7 10*3/uL (ref 0.1–1.0)
Monocytes Relative: 12 %
NEUTROS ABS: 3.6 10*3/uL (ref 1.7–7.7)
NEUTROS PCT: 64 %
Platelets: 149 10*3/uL — ABNORMAL LOW (ref 150–400)
RBC: 3.56 MIL/uL — ABNORMAL LOW (ref 4.22–5.81)
RDW: 12.5 % (ref 11.5–15.5)
WBC: 5.7 10*3/uL (ref 4.0–10.5)

## 2015-12-16 LAB — COMPREHENSIVE METABOLIC PANEL
ALBUMIN: 3.3 g/dL — AB (ref 3.5–5.0)
ALK PHOS: 74 U/L (ref 38–126)
ALT: 20 U/L (ref 17–63)
ANION GAP: 6 (ref 5–15)
AST: 23 U/L (ref 15–41)
BILIRUBIN TOTAL: 0.7 mg/dL (ref 0.3–1.2)
BUN: 39 mg/dL — ABNORMAL HIGH (ref 6–20)
CALCIUM: 9.1 mg/dL (ref 8.9–10.3)
CO2: 24 mmol/L (ref 22–32)
Chloride: 108 mmol/L (ref 101–111)
Creatinine, Ser: 1.22 mg/dL (ref 0.61–1.24)
GFR, EST AFRICAN AMERICAN: 54 mL/min — AB (ref >60)
GFR, EST NON AFRICAN AMERICAN: 46 mL/min — AB (ref >60)
Glucose, Bld: 89 mg/dL (ref 65–99)
POTASSIUM: 4.3 mmol/L (ref 3.5–5.1)
Sodium: 138 mmol/L (ref 135–145)
TOTAL PROTEIN: 6.5 g/dL (ref 6.5–8.1)

## 2015-12-16 LAB — TROPONIN I: TROPONIN I: 0.03 ng/mL (ref <0.031)

## 2015-12-16 LAB — PROTIME-INR
INR: 1.15 (ref 0.00–1.49)
PROTHROMBIN TIME: 14.9 s (ref 11.6–15.2)

## 2015-12-16 NOTE — ED Notes (Signed)
Unable to get standing vitals.

## 2015-12-16 NOTE — Discharge Instructions (Signed)
Hypotension Decrease your Toprol dose to 50 mg daily (half your usual dose). As your heart beats, it forces blood through your arteries. This force is your blood pressure. If your blood pressure is too low for you to go about your normal activities or to support the organs of your body, you have hypotension. Hypotension is also referred to as low blood pressure. When your blood pressure becomes too low, you may not get enough blood to your brain. As a result, you may feel weak, feel lightheaded, or develop a rapid heart rate. In a more severe case, you may faint. CAUSES Various conditions can cause hypotension. These include:  Blood loss.  Dehydration.  Heart or endocrine problems.  Pregnancy.  Severe infection.  Not having a well-balanced diet filled with needed nutrients.  Severe allergic reactions (anaphylaxis). Some medicines, such as blood pressure medicine or water pills (diuretics), may lower your blood pressure below normal. Sometimes taking too much medicine or taking medicine not as directed can cause hypotension. TREATMENT  Hospitalization is sometimes required for hypotension if fluid or blood replacement is needed, if time is needed for medicines to wear off, or if further monitoring is needed. Treatment might include changing your diet, changing your medicines (including medicines aimed at raising your blood pressure), and use of support stockings. HOME CARE INSTRUCTIONS   Drink enough fluids to keep your urine clear or pale yellow.  Take your medicines as directed by your health care provider.  Get up slowly from reclining or sitting positions. This gives your blood pressure a chance to adjust.  Wear support stockings as directed by your health care provider.  Maintain a healthy diet by including nutritious food, such as fruits, vegetables, nuts, whole grains, and lean meats. SEEK MEDICAL CARE IF:  You have vomiting or diarrhea.  You have a fever for more than 2-3  days.  You feel more thirsty than usual.  You feel weak and tired. SEEK IMMEDIATE MEDICAL CARE IF:   You have chest pain or a fast or irregular heartbeat.  You have a loss of feeling in some part of your body, or you lose movement in your arms or legs.  You have trouble speaking.  You become sweaty or feel lightheaded.  You faint. MAKE SURE YOU:   Understand these instructions.  Will watch your condition.  Will get help right away if you are not doing well or get worse.   This information is not intended to replace advice given to you by your health care provider. Make sure you discuss any questions you have with your health care provider.   Document Released: 10/04/2005 Document Revised: 07/25/2013 Document Reviewed: 04/06/2013 Elsevier Interactive Patient Education [employer]

## 2015-12-16 NOTE — ED Notes (Signed)
Pt was sent from urology d/t the staff there being unable to obtain BP or temperature. Pt denies pain, is very hard of hearing. Unable to get oral temp in triage, will attempt rectal once patient in a room. Unable to obtain O2 sat in triage also.

## 2015-12-16 NOTE — ED Notes (Signed)
Bed: WA05 Expected date:  Expected time:  Means of arrival:  Comments: TR1 

## 2015-12-16 NOTE — ED Provider Notes (Signed)
CSN: [id number]     Arrival date & time 12/16/15  1512 History   First MD Initiated Contact with Patient 12/16/15 1601     Chief Complaint  Patient presents with  . Hypotension     (Consider location/radiation/quality/duration/timing/severity/associated sxs/prior Treatment) HPI Patient was going to his urology appointment for follow-up today. He had had hematuria last week. When in the office, he was noted to have low blood pressure. Due to concerns for low blood pressure and age, he was referred to the emergency department. Family members report that they did not have any specific testing done while at the urologist's office. They have not noted him to have any symptoms since his treatment in the emergency department for hematuria. They report he normally urinates into a diaper so they can calculate the exact volume. There has not been ongoing blood or clot in the urine. They report he has been eating well. They report normal intake of breakfast this morning. He took his regular morning medications including his blood pressure medications. Past Medical History  Diagnosis Date  . Hypertension    History reviewed. No pertinent past surgical history. History reviewed. No pertinent family history. Social History  Substance Use Topics  . Smoking status: Never Smoker   . Smokeless tobacco: None  . Alcohol Use: No    Review of Systems 10 Systems reviewed and are negative for acute change except as noted in the HPI. Review systems is from family members.   Allergies  Review of patient's allergies indicates no known allergies.  Home Medications   Prior to Admission medications   Medication Sig Start Date End Date Taking? Authorizing Provider  metoprolol succinate (TOPROL-XL) 100 MG 24 hr tablet Take 100 mg by mouth daily. 11/09/14  Yes Historical Provider, MD  levofloxacin (LEVAQUIN) 750 MG tablet Take 1 tablet (750 mg total) by mouth daily. X 7 days 12/14/15   Geoffery Lyons, MD   BP  112/79 mmHg  Pulse 95  Resp 18  SpO2 100% Physical Exam  Constitutional:  Patient is very thin. He is nontoxic. He is alert and assists in following commands when he can hear them. Respiratory distress.  HENT:  Head: Normocephalic and atraumatic.  Mouth/Throat: Oropharynx is clear and moist.  Eyes: EOM are normal.  Cardiovascular:  Irregularly irregular. 2/6 systolic ejection murmur.  Pulmonary/Chest: Effort normal and breath sounds normal.  Abdominal: Soft. Bowel sounds are normal. He exhibits no distension. There is no tenderness.  Genitourinary:  Normal appearance of male genitalia. Diaper is damp but no visible blood or discoloration of urine.  Musculoskeletal:  Patient's extremities are very thin and have muscular atrophy. He does not have any significant peripheral edema. And move and use his extremities.  Neurological: He is alert. He exhibits normal muscle tone. Coordination normal.  Skin: Skin is warm and dry.  Psychiatric: He has a normal mood and affect.  Patient is pleasant and cooperative. He is very hard of hearing.    ED Course  Procedures (including critical care time) Labs Review Labs Reviewed  COMPREHENSIVE METABOLIC PANEL - Abnormal; Notable for the following:    BUN 39 (*)    Albumin 3.3 (*)    GFR calc non Af Amer 46 (*)    GFR calc Af Amer 54 (*)    All other components within normal limits  CBC WITH DIFFERENTIAL/PLATELET - Abnormal; Notable for the following:    RBC 3.56 (*)    Hemoglobin 12.2 (*)    HCT 36.7 (*)  MCV 103.1 (*)    MCH 34.3 (*)    Platelets 149 (*)    All other components within normal limits  TROPONIN I  PROTIME-INR  URINALYSIS, ROUTINE W REFLEX MICROSCOPIC (NOT AT [facility])    Imaging Review No results found. I have personally reviewed and evaluated these images and lab results as part of my medical decision-making.   EKG Interpretation   Date/Time:  Tuesday December 16 2015 15:40:10 EST Ventricular Rate:  101 PR  Interval:    QRS Duration: 82 QT Interval:  353 QTC Calculation: 457 R Axis:   -47 Text Interpretation:  Atrial fibrillation Left anterior fascicular block  Anterior infarct, old No previous tracing Confirmed by KNAPP  MD-J, JON  ([id number]) on 12/16/2015 3:51:51 PM      MDM   Final diagnoses:  Other specified hypotension   Patient has well appearance with negative diagnostic workup. At this time I suspect the patient's hypotension was due to his a.m. blood pressure medications. Patient will be discharged with instructions to half his blood pressure medication and follow-up with family physician's week. Instructions are given to return if there should be changes in the patient's general condition such as changes in appetite or mental status.    Arby Barrette, MD 12/16/15 [phone]

## 2015-12-25 DIAGNOSIS — R319 Hematuria, unspecified: Secondary | ICD-10-CM | POA: Diagnosis not present

## 2015-12-25 DIAGNOSIS — R5383 Other fatigue: Secondary | ICD-10-CM | POA: Diagnosis not present

## 2015-12-25 DIAGNOSIS — I959 Hypotension, unspecified: Secondary | ICD-10-CM | POA: Diagnosis not present

## 2015-12-25 DIAGNOSIS — N390 Urinary tract infection, site not specified: Secondary | ICD-10-CM | POA: Diagnosis not present

## 2015-12-29 DIAGNOSIS — N99110 Postprocedural urethral stricture, male, meatal: Secondary | ICD-10-CM | POA: Diagnosis not present

## 2015-12-29 DIAGNOSIS — R310 Gross hematuria: Secondary | ICD-10-CM | POA: Diagnosis not present

## 2015-12-29 DIAGNOSIS — Z0000 Encounter for general adult medical examination without abnormal findings: Secondary | ICD-10-CM | POA: Diagnosis not present

## 2015-12-31 DIAGNOSIS — I482 Chronic atrial fibrillation: Secondary | ICD-10-CM | POA: Diagnosis not present

## 2016-01-07 DIAGNOSIS — R634 Abnormal weight loss: Secondary | ICD-10-CM | POA: Diagnosis not present

## 2016-01-07 DIAGNOSIS — R319 Hematuria, unspecified: Secondary | ICD-10-CM | POA: Diagnosis not present

## 2016-01-07 DIAGNOSIS — I959 Hypotension, unspecified: Secondary | ICD-10-CM | POA: Diagnosis not present

## 2016-01-07 DIAGNOSIS — R5383 Other fatigue: Secondary | ICD-10-CM | POA: Diagnosis not present

## 2016-02-10 ENCOUNTER — Encounter: Payer: Self-pay | Admitting: Podiatry

## 2016-02-10 ENCOUNTER — Ambulatory Visit (INDEPENDENT_AMBULATORY_CARE_PROVIDER_SITE_OTHER): Payer: Commercial Managed Care - HMO | Admitting: Podiatry

## 2016-02-10 DIAGNOSIS — M79676 Pain in unspecified toe(s): Secondary | ICD-10-CM | POA: Diagnosis not present

## 2016-02-10 DIAGNOSIS — B351 Tinea unguium: Secondary | ICD-10-CM | POA: Diagnosis not present

## 2016-02-10 NOTE — Progress Notes (Signed)
Patient ID: William Nicholson, male   DOB: 27-Apr-1913, [age] y.o.   MRN: [id number] HPI  Complaint:  Visit Type: Patient returns to my office for continued preventative foot care services. Complaint: Patient states" my nails have grown long and thick and become painful to walk and wear shoes"  . This patient  presents for preventative foot care services. No changes to ROS  Podiatric Exam: Vascular: dorsalis pedis and posterior tibial pulses are non-palpable. Capillary return is diminished. Cold feet noted.. Skin turgor WNL, bilateral swelling  Sensorium: Normal Semmes Weinstein monofilament test. Normal tactile sensation bilaterally.  Nail Exam: Pt has thick disfigured discolored nails with subungual debris noted bilateral entire nail hallux through fifth toenails Ulcer Exam: There is no evidence of ulcer or pre-ulcerative changes or infection. Orthopedic Exam: Muscle tone and strength are WNL. No limitations in general ROM. No crepitus or effusions noted. Foot type and digits show no abnormalities. Bony prominences are unremarkable. Skin: No Porokeratosis. No infection or ulcers  Diagnosis:  Onychomycosis, Pain in right toe, pain in left toes  Treatment & Plan Procedures and Treatment: Consent by patient was obtained for treatment procedures. The patient understood the discussion of treatment and procedures well. All questions were answered thoroughly reviewed. Debridement of mycotic and hypertrophic toenails, 1 through 5 bilateral and clearing of subungual debris. No ulceration, no infection noted.  Return Visit-Office Procedure: Patient instructed to return to the office for a follow up visit 3 months for continued evaluation and treatment.    Helane GuntherGregory Kymari Nuon DPM

## 2016-03-02 DIAGNOSIS — E538 Deficiency of other specified B group vitamins: Secondary | ICD-10-CM | POA: Diagnosis not present

## 2016-03-12 ENCOUNTER — Ambulatory Visit: Payer: Commercial Managed Care - HMO | Admitting: Podiatry

## 2016-03-16 ENCOUNTER — Emergency Department (HOSPITAL_COMMUNITY)
Admission: EM | Admit: 2016-03-16 | Discharge: 2016-03-16 | Disposition: A | Discharge status: Home / Self Care | Payer: Commercial Managed Care - HMO | Attending: Emergency Medicine | Admitting: Emergency Medicine

## 2016-03-16 ENCOUNTER — Emergency Department (HOSPITAL_COMMUNITY): Payer: Commercial Managed Care - HMO

## 2016-03-16 ENCOUNTER — Encounter (HOSPITAL_COMMUNITY): Payer: Self-pay

## 2016-03-16 DIAGNOSIS — Z79899 Other long term (current) drug therapy: Secondary | ICD-10-CM | POA: Insufficient documentation

## 2016-03-16 DIAGNOSIS — I10 Essential (primary) hypertension: Secondary | ICD-10-CM | POA: Diagnosis not present

## 2016-03-16 DIAGNOSIS — R51 Headache: Secondary | ICD-10-CM | POA: Diagnosis not present

## 2016-03-16 DIAGNOSIS — Z792 Long term (current) use of antibiotics: Secondary | ICD-10-CM | POA: Insufficient documentation

## 2016-03-16 DIAGNOSIS — R05 Cough: Secondary | ICD-10-CM | POA: Diagnosis not present

## 2016-03-16 DIAGNOSIS — R531 Weakness: Secondary | ICD-10-CM | POA: Diagnosis not present

## 2016-03-16 DIAGNOSIS — N390 Urinary tract infection, site not specified: Secondary | ICD-10-CM | POA: Insufficient documentation

## 2016-03-16 LAB — CBC
HCT: 35.8 % — ABNORMAL LOW (ref 39.0–52.0)
Hemoglobin: 12.5 g/dL — ABNORMAL LOW (ref 13.0–17.0)
MCH: 34.3 pg — ABNORMAL HIGH (ref 26.0–34.0)
MCHC: 34.9 g/dL (ref 30.0–36.0)
MCV: 98.4 fL (ref 78.0–100.0)
PLATELETS: 144 10*3/uL — AB (ref 150–400)
RBC: 3.64 MIL/uL — AB (ref 4.22–5.81)
RDW: 12.5 % (ref 11.5–15.5)
WBC: 4.2 10*3/uL (ref 4.0–10.5)

## 2016-03-16 LAB — URINALYSIS, ROUTINE W REFLEX MICROSCOPIC
Bilirubin Urine: NEGATIVE
Glucose, UA: NEGATIVE mg/dL
KETONES UR: NEGATIVE mg/dL
NITRITE: NEGATIVE
PROTEIN: 30 mg/dL — AB
Specific Gravity, Urine: 1.024 (ref 1.005–1.030)
pH: 6.5 (ref 5.0–8.0)

## 2016-03-16 LAB — BASIC METABOLIC PANEL
Anion gap: 3 — ABNORMAL LOW (ref 5–15)
BUN: 32 mg/dL — ABNORMAL HIGH (ref 6–20)
CALCIUM: 9 mg/dL (ref 8.9–10.3)
CHLORIDE: 113 mmol/L — AB (ref 101–111)
CO2: 24 mmol/L (ref 22–32)
CREATININE: 1.31 mg/dL — AB (ref 0.61–1.24)
GFR, EST AFRICAN AMERICAN: 49 mL/min — AB (ref >60)
GFR, EST NON AFRICAN AMERICAN: 43 mL/min — AB (ref >60)
Glucose, Bld: 104 mg/dL — ABNORMAL HIGH (ref 65–99)
Potassium: 4.3 mmol/L (ref 3.5–5.1)
SODIUM: 140 mmol/L (ref 135–145)

## 2016-03-16 LAB — URINE MICROSCOPIC-ADD ON

## 2016-03-16 LAB — CBG MONITORING, ED: GLUCOSE-CAPILLARY: 117 mg/dL — AB (ref 65–99)

## 2016-03-16 MED ORDER — SODIUM CHLORIDE 0.9 % IV BOLUS (SEPSIS)
250.0000 mL | Freq: Once | INTRAVENOUS | Status: AC
  Administered 2016-03-16: 250 mL via INTRAVENOUS

## 2016-03-16 MED ORDER — AMOXICILLIN 500 MG PO CAPS: 500.0000 mg | ORAL_CAPSULE | Freq: Three times a day (TID) | ORAL | Status: DC

## 2016-03-16 MED ORDER — DEXTROSE 5 % IV SOLN
1.0000 g | Freq: Once | INTRAVENOUS | Status: AC
  Administered 2016-03-16: 1 g via INTRAVENOUS
  Filled 2016-03-16: qty 10

## 2016-03-16 MED ORDER — SODIUM CHLORIDE 0.9 % IV BOLUS (SEPSIS)
500.0000 mL | Freq: Once | INTRAVENOUS | Status: AC
  Administered 2016-03-16: 500 mL via INTRAVENOUS

## 2016-03-16 NOTE — ED Notes (Signed)
Pt has been weaker than normal for last 2 days per family.  Pt ate breakfast today but only drank ensure for lunch.  Complained of headache.  Orientation to baseline.  Hard of hearing.  Grips equal.  No unilateral weakness noted.  No fever.  No urinary symptoms.  Some cough noted yesterday.

## 2016-03-16 NOTE — ED Provider Notes (Signed)
CSN: [id number]     Arrival date & time 03/16/16  1706 History   First MD Initiated Contact with Patient 03/16/16 2000     Chief Complaint  Patient presents with  . Headache  . Weakness     (Consider location/radiation/quality/duration/timing/severity/associated sxs/prior Treatment) HPI Comments: This is a 82 year old gentleman cared for by his family who report decreased activity for the past several days, today did not want to get out of bed and had decreased appetite and complaints of a headache.  He was not given any medication for his complaint of headache but was taken to his PCP who sent him to the emergency department for a head CT.  Patient is a [age] y.o. male presenting with headaches and weakness. The history is provided by the patient and a relative.  Headache Quality:  Unable to specify Severity currently:  0/10 Severity at highest:  Unable to specify Onset quality:  Unable to specify Duration:  12 hours Timing:  Intermittent Progression:  Resolved Chronicity:  New Relieved by:  None tried Ineffective treatments:  None tried Associated symptoms: weakness   Associated symptoms: no abdominal pain, no congestion, no cough, no diarrhea, no fever, no nausea, no syncope and no vomiting   Weakness:    Severity:  Unable to specify   Onset quality:  Gradual   Duration:  3 days   Timing:  Unable to specify   Progression:  Unable to specify Weakness Associated symptoms include headaches and weakness. Pertinent negatives include no abdominal pain, chest pain, congestion, coughing, fever, nausea or vomiting.    Past Medical History  Diagnosis Date  . Hypertension    History reviewed. No pertinent past surgical history. History reviewed. No pertinent family history. Social History  Substance Use Topics  . Smoking status: Never Smoker   . Smokeless tobacco: None  . Alcohol Use: No    Review of Systems  Constitutional: Positive for activity change and appetite change.  Negative for fever.  HENT: Negative for congestion and rhinorrhea.   Respiratory: Negative for cough.   Cardiovascular: Negative for chest pain and syncope.  Gastrointestinal: Negative for nausea, vomiting, abdominal pain, diarrhea and constipation.  Musculoskeletal: Negative for gait problem.  Neurological: Positive for weakness and headaches.  All other systems reviewed and are negative.     Allergies  Review of patient's allergies indicates no known allergies.  Home Medications   Prior to Admission medications   Medication Sig Start Date End Date Taking? Authorizing Provider  finasteride (PROSCAR) 5 MG tablet Take 5 mg by mouth daily. 02/16/16  Yes Historical Provider, MD  metoprolol succinate (TOPROL-XL) 100 MG 24 hr tablet Take 100 mg by mouth daily. 11/09/14  Yes Historical Provider, MD  Rivaroxaban (XARELTO) 15 MG TABS tablet Take 15 mg by mouth daily.   Yes Historical Provider, MD  amoxicillin (AMOXIL) 500 MG capsule Take 1 capsule (500 mg total) by mouth 3 (three) times daily. 03/16/16   Earley FavorGail Maitlyn Penza, NP  levofloxacin (LEVAQUIN) 750 MG tablet Take 1 tablet (750 mg total) by mouth daily. X 7 days Patient not taking: Reported on 03/16/2016 12/14/15   Geoffery Lyonsouglas Delo, MD   BP 137/90 mmHg  Pulse 86  Temp(Src) 98 F (36.7 C) (Rectal)  Resp 18  SpO2 98% Physical Exam  Constitutional: He appears well-developed and well-nourished. No distress.  HENT:  Head: Atraumatic.  Right Ear: External ear normal.  Left Ear: External ear normal.  Hearing aid R ear  Eyes: Pupils are equal, round,  and reactive to light.  Neck: Normal range of motion.  Cardiovascular: Tachycardia present.   Pulmonary/Chest: Effort normal and breath sounds normal.  Abdominal: Soft. He exhibits no distension. There is no tenderness.  Musculoskeletal: Normal range of motion. He exhibits no edema or tenderness.  Lymphadenopathy:    He has no cervical adenopathy.  Neurological: He is alert.  Skin: Skin is warm and  dry.  Nursing note and vitals reviewed.   ED Course  Procedures (including critical care time) Labs Review Labs Reviewed  BASIC METABOLIC PANEL - Abnormal; Notable for the following:    Chloride 113 (*)    Glucose, Bld 104 (*)    BUN 32 (*)    Creatinine, Ser 1.31 (*)    GFR calc non Af Amer 43 (*)    GFR calc Af Amer 49 (*)    Anion gap 3 (*)    All other components within normal limits  CBC - Abnormal; Notable for the following:    RBC 3.64 (*)    Hemoglobin 12.5 (*)    HCT 35.8 (*)    MCH 34.3 (*)    Platelets 144 (*)    All other components within normal limits  URINALYSIS, ROUTINE W REFLEX MICROSCOPIC (NOT AT [facility]) - Abnormal; Notable for the following:    Color, Urine AMBER (*)    APPearance CLOUDY (*)    Hgb urine dipstick MODERATE (*)    Protein, ur 30 (*)    Leukocytes, UA MODERATE (*)    All other components within normal limits  URINE MICROSCOPIC-ADD ON - Abnormal; Notable for the following:    Squamous Epithelial / LPF 0-5 (*)    Bacteria, UA MANY (*)    All other components within normal limits  CBG MONITORING, ED - Abnormal; Notable for the following:    Glucose-Capillary 117 (*)    All other components within normal limits    Imaging Review Dg Chest 2 View  03/16/2016  CLINICAL DATA:  Weakness and cough EXAM: CHEST  2 VIEW COMPARISON:  November 05, 2010 FINDINGS: There is no appreciable edema or consolidation. There is mild bilateral apical pleural thickening, stable. Heart size and pulmonary vascularity are normal. No adenopathy. There is degenerative change in the thoracic spine. IMPRESSION: No edema or consolidation. Electronically Signed   By: Bretta Bang III M.D.   On: 03/16/2016 17:55   Ct Head Wo Contrast  03/16/2016  CLINICAL DATA:  Acute onset of generalized weakness. Headache. Initial encounter. EXAM: CT HEAD WITHOUT CONTRAST TECHNIQUE: Contiguous axial images were obtained from the base of the skull through the vertex without intravenous  contrast. COMPARISON:  None. FINDINGS: There is no evidence of acute infarction, mass lesion, or intra- or extra-axial hemorrhage on CT. Prominence of the ventricles and sulci reflects moderately severe cortical volume loss. Cerebellar atrophy is noted. Scattered periventricular and subcortical white matter change likely reflects small vessel ischemic microangiopathy. Chronic lacunar infarcts are seen at the basal ganglia bilaterally. The brainstem and fourth ventricle are within normal limits. The cerebral hemispheres demonstrate grossly normal gray-white differentiation. No mass effect or midline shift is seen. There is no evidence of fracture; visualized osseous structures are unremarkable in appearance. The orbits are within normal limits. The paranasal sinuses and mastoid air cells are well-aerated. No significant soft tissue abnormalities are seen. IMPRESSION: 1. No acute intracranial pathology seen on CT. 2. Moderately severe cortical volume loss and scattered small vessel ischemic microangiopathy. 3. Chronic lacunar infarcts at the basal ganglia bilaterally.  Electronically Signed   By: Roanna Raider M.D.   On: 03/16/2016 21:26   I have personally reviewed and evaluated these images and lab results as part of my medical decision-making.   EKG Interpretation   Date/Time:  Tuesday Mar 16 2016 18:06:57 EDT Ventricular Rate:  80 PR Interval:    QRS Duration: 76 QT Interval:  354 QTC Calculation: 408 R Axis:   -59 Text Interpretation:  Atrial fibrillation Left anterior fascicular block  Abnormal R-wave progression, early transition Anteroseptal infarct, old  since last tracing no significant change Confirmed by Effie Shy  MD, ELLIOTT  [id number]) on 03/16/2016 6:12:49 PM     Urine shows that the patient has a urinary tract infection.  He will be given IV Rocephin 250 cc of additional IV fluid and discharge home with prescription for amoxicillin.  Follow-up with his primary care physician.  This has  been discussed with the family who are in agreement MDM   Final diagnoses:  UTI (lower urinary tract infection)         Earley Favor, NP 03/16/16 2147  Earley Favor, NP 03/16/16 2249  Leta Baptist, MD 03/20/16 0110

## 2016-03-16 NOTE — Discharge Instructions (Signed)
As discussed, your dad has a urinary tract infection.  This is being treated with a penicillin-based antibiotic.  Please measured exhibits this as directed 3 times a day with a full glass of water.  Please try to encourage her father to drink more fluids.  Please make an appointment with your primary care physician for follow-up in 7-10 days.  Return anytime there is worsening symptoms would include nausea, vomiting, diarrhea, decreased mental status

## 2016-03-16 NOTE — ED Notes (Signed)
Patient transported to CT 

## 2016-03-16 NOTE — ED Notes (Signed)
Unable to get oral temp d/t dryness

## 2016-04-05 DIAGNOSIS — N390 Urinary tract infection, site not specified: Secondary | ICD-10-CM | POA: Diagnosis not present

## 2016-04-05 DIAGNOSIS — E538 Deficiency of other specified B group vitamins: Secondary | ICD-10-CM | POA: Diagnosis not present

## 2016-04-05 DIAGNOSIS — I482 Chronic atrial fibrillation: Secondary | ICD-10-CM | POA: Diagnosis not present

## 2016-04-05 DIAGNOSIS — R319 Hematuria, unspecified: Secondary | ICD-10-CM | POA: Diagnosis not present

## 2016-04-08 DIAGNOSIS — R310 Gross hematuria: Secondary | ICD-10-CM | POA: Diagnosis not present

## 2016-04-15 DIAGNOSIS — N401 Enlarged prostate with lower urinary tract symptoms: Secondary | ICD-10-CM | POA: Diagnosis not present

## 2016-04-15 DIAGNOSIS — N3942 Incontinence without sensory awareness: Secondary | ICD-10-CM | POA: Diagnosis not present

## 2016-04-15 DIAGNOSIS — R310 Gross hematuria: Secondary | ICD-10-CM | POA: Diagnosis not present

## 2016-04-29 DIAGNOSIS — N390 Urinary tract infection, site not specified: Secondary | ICD-10-CM | POA: Diagnosis not present

## 2016-05-11 ENCOUNTER — Ambulatory Visit (INDEPENDENT_AMBULATORY_CARE_PROVIDER_SITE_OTHER): Payer: Commercial Managed Care - HMO | Admitting: Podiatry

## 2016-05-11 DIAGNOSIS — B351 Tinea unguium: Secondary | ICD-10-CM | POA: Diagnosis not present

## 2016-05-11 DIAGNOSIS — M79676 Pain in unspecified toe(s): Secondary | ICD-10-CM

## 2016-05-11 NOTE — Progress Notes (Signed)
Patient ID: LENA SANTILLI, male   DOB: 1913/02/11, 82 y.o.   MRN: [id number] HPI  Complaint:  Visit Type: Patient returns to my office for continued preventative foot care services. Complaint: Patient states" my nails have grown long and thick and become painful to walk and wear shoes"  . This patient  presents for preventative foot care services. No changes to ROS  Podiatric Exam: Vascular: dorsalis pedis and posterior tibial pulses are non-palpable. Capillary return is diminished. Cold feet noted.. Skin turgor WNL, bilateral swelling  Sensorium: Normal Semmes Weinstein monofilament test. Normal tactile sensation bilaterally.  Nail Exam: Pt has thick disfigured discolored nails with subungual debris noted bilateral entire nail hallux through fifth toenails Ulcer Exam: There is no evidence of ulcer or pre-ulcerative changes or infection. Orthopedic Exam: Muscle tone and strength are WNL. No limitations in general ROM. No crepitus or effusions noted. Foot type and digits show no abnormalities. Bony prominences are unremarkable. Skin: No Porokeratosis. No infection or ulcers  Diagnosis:  Onychomycosis, Pain in right toe, pain in left toes  Treatment & Plan Procedures and Treatment: Consent by patient was obtained for treatment procedures. The patient understood the discussion of treatment and procedures well. All questions were answered thoroughly reviewed. Debridement of mycotic and hypertrophic toenails, 1 through 5 bilateral and clearing of subungual debris. No ulceration, no infection noted. There may have been trauma left hallux toenail but has healed with no infection noted. Return Visit-Office Procedure: Patient instructed to return to the office for a follow up visit 3 months for continued evaluation and treatment.    Helane Gunther DPM

## 2016-05-17 DIAGNOSIS — N390 Urinary tract infection, site not specified: Secondary | ICD-10-CM | POA: Diagnosis not present

## 2016-05-26 DIAGNOSIS — E860 Dehydration: Secondary | ICD-10-CM | POA: Diagnosis not present

## 2016-05-26 DIAGNOSIS — E46 Unspecified protein-calorie malnutrition: Secondary | ICD-10-CM | POA: Diagnosis not present

## 2016-06-02 DIAGNOSIS — J069 Acute upper respiratory infection, unspecified: Secondary | ICD-10-CM | POA: Diagnosis not present

## 2016-08-03 ENCOUNTER — Ambulatory Visit (INDEPENDENT_AMBULATORY_CARE_PROVIDER_SITE_OTHER): Payer: Commercial Managed Care - HMO | Admitting: Podiatry

## 2016-08-03 ENCOUNTER — Encounter: Payer: Self-pay | Admitting: Podiatry

## 2016-08-03 VITALS — BP 125/80 | HR 89 | Resp 14

## 2016-08-03 DIAGNOSIS — B351 Tinea unguium: Secondary | ICD-10-CM

## 2016-08-03 DIAGNOSIS — M79676 Pain in unspecified toe(s): Secondary | ICD-10-CM

## 2016-08-03 NOTE — Progress Notes (Signed)
Patient ID: William Nicholson, male   DOB: 01/26/1913, [age] y.o.   MRN: [id number] HPI  Complaint:  Visit Type: Patient returns to my office for continued preventative foot care services. Complaint: Patient states" my nails have grown long and thick and become painful to walk and wear shoes"  . This patient  presents for preventative foot care services. No changes to ROS  Podiatric Exam: Vascular: dorsalis pedis and posterior tibial pulses are non-palpable. Capillary return is diminished. Cold feet noted.. Skin turgor WNL, bilateral swelling  Sensorium: Normal Semmes Weinstein monofilament test. Normal tactile sensation bilaterally.  Nail Exam: Pt has thick disfigured discolored nails with subungual debris noted bilateral entire nail hallux through fifth toenails Ulcer Exam: There is no evidence of ulcer or pre-ulcerative changes or infection. Orthopedic Exam: Muscle tone and strength are WNL. No limitations in general ROM. No crepitus or effusions noted. Foot type and digits show no abnormalities. Bony prominences are unremarkable. Skin: No Porokeratosis. No infection or ulcers  Diagnosis:  Onychomycosis, Pain in right toe, pain in left toes  Treatment & Plan Procedures and Treatment: Consent by patient was obtained for treatment procedures. The patient understood the discussion of treatment and procedures well. All questions were answered thoroughly reviewed. Debridement of mycotic and hypertrophic toenails, 1 through 5 bilateral and clearing of subungual debris. No ulceration, no infection noted. There may have been trauma right  hallux toenail but has healed with blister subungually.  Told son to watch this toe. Return Visit-Office Procedure: Patient instructed to return to the office for a follow up visit 3 months for continued evaluation and treatment.    William GuntherGregory Scotty Nicholson DPM

## 2016-08-05 ENCOUNTER — Ambulatory Visit (INDEPENDENT_AMBULATORY_CARE_PROVIDER_SITE_OTHER): Payer: Commercial Managed Care - HMO | Admitting: Podiatry

## 2016-08-05 ENCOUNTER — Encounter: Payer: Self-pay | Admitting: Podiatry

## 2016-08-05 VITALS — BP 146/101 | HR 88 | Resp 14

## 2016-08-05 DIAGNOSIS — L089 Local infection of the skin and subcutaneous tissue, unspecified: Secondary | ICD-10-CM

## 2016-08-05 DIAGNOSIS — D696 Thrombocytopenia, unspecified: Secondary | ICD-10-CM | POA: Diagnosis not present

## 2016-08-05 DIAGNOSIS — I10 Essential (primary) hypertension: Secondary | ICD-10-CM | POA: Diagnosis not present

## 2016-08-05 DIAGNOSIS — B351 Tinea unguium: Secondary | ICD-10-CM | POA: Diagnosis not present

## 2016-08-05 DIAGNOSIS — I482 Chronic atrial fibrillation: Secondary | ICD-10-CM | POA: Diagnosis not present

## 2016-08-05 DIAGNOSIS — S90821D Blister (nonthermal), right foot, subsequent encounter: Secondary | ICD-10-CM

## 2016-08-05 DIAGNOSIS — Z23 Encounter for immunization: Secondary | ICD-10-CM | POA: Diagnosis not present

## 2016-08-05 DIAGNOSIS — E538 Deficiency of other specified B group vitamins: Secondary | ICD-10-CM | POA: Diagnosis not present

## 2016-08-05 NOTE — Progress Notes (Signed)
Patient ID: William Nicholson, male   DOB: 1912-11-25, [age] y.o.   MRN: [id number] HPI  Complaint:  Visit Type: Patient returns to my office for continued preventative foot care services. Complaint: Patient   Says the right great toe has been bleeding since his visit on Tuesday.  I provided foot care services for him and found he had subungual blister under his big toe right foot.  I told the patient helper to call the office if the nail worsened.  The patient daughter comes saying she was never told about his nail problem by the brother in law or his wife.  Therefore she was concerned and presents to the office for evaluation and treatment righ nt big toe.   Podiatric Exam: Vascular: dorsalis pedis and posterior tibial pulses are non-palpable. Capillary return is diminished. Cold feet noted.. Skin turgor WNL, bilateral swelling  Sensorium: Normal Semmes Weinstein monofilament test. Normal tactile sensation bilaterally.  Nail Exam: Pt has thick disfigured discolored nails with subungual debris noted bilateral entire nail hallux through fifth toenails Ulcer Exam: There is no evidence of ulcer or pre-ulcerative changes or infection. Orthopedic Exam: Muscle tone and strength are WNL. No limitations in general ROM. No crepitus or effusions noted. Foot type and digits show no abnormalities. Bony prominences are unremarkable. Skin: No Porokeratosis. No infection or ulcers  Diagnosis:  Onychomycosis, Pain in right toe, pain in left toes  Treatment & Plan Procedures and Treatment: Consent by patient was obtained for treatment procedures. The patient understood the discussion of treatment and procedures well. All questions were answered thoroughly reviewed.  Nail surgery performed.  Treatment options and alternatives discussed.  Recommended an incision and drainage and patient agreed.   The right hallux was prepped with alcohol and a 3cc. of  2% lidocaine plain was administered in a digital block fashion.   The toe was then prepped with betadine solution .  The offending nail border was then excised and all necrotic tissue was resected.  The area was then cleansed  and antibiotic ointment and a dry sterile dressing was applied.  The patient was dispensed instructions for aftercare. RTC 1 week   Helane GuntherGregory Jadalyn Oliveri DPM Return Visit-Office Procedure: Patient instructed to return to the office for a follow up visit 3 months for continued evaluation and treatment.    Helane GuntherGregory Bren Borys DPM

## 2016-08-12 ENCOUNTER — Ambulatory Visit (INDEPENDENT_AMBULATORY_CARE_PROVIDER_SITE_OTHER): Payer: Commercial Managed Care - HMO | Admitting: Podiatry

## 2016-08-12 DIAGNOSIS — Z09 Encounter for follow-up examination after completed treatment for conditions other than malignant neoplasm: Secondary | ICD-10-CM

## 2016-08-12 NOTE — Progress Notes (Signed)
Patient ID: William Nicholson, male   DOB: 12/13/1912, [age] y.o.   MRN: [id number] HPI  Complaint:  Visit Type: Patient returns to my office for evaluation of his right great toenail area right foot. Patient was seen initially for preventative foot care services last Tuesday and developed developed a full-blown blister subungually. He presents to the office on Thursday which time incision and drainage of the nail plate was performed. Home instructions and for soaks and bandaging was given. He presents the office today for evaluation and treatment of this surgical site. Patient denies any pain or discomfort at the site of the nail avulsion right hallux  Podiatric Exam: Vascular: dorsalis pedis and posterior tibial pulses are non-palpable. Capillary return is diminished. Cold feet noted.. Skin turgor WNL, bilateral swelling  Sensorium: Normal Semmes Weinstein monofilament test. Normal tactile sensation bilaterally.  Nail Exam: Red, granulation tissue noted on the dorsum of the nail bed right hallux. There is no evidence of any pus or drainage noted at the proximal nail fold thick disfigured discolored nails with subungual debris noted both feet. Ulcer Exam: There is no evidence of ulcer or pre-ulcerative changes or infection. Orthopedic Exam: Muscle tone and strength are WNL. No limitations in general ROM. No crepitus or effusions noted. Foot type and digits show no abnormalities. Bony prominences are unremarkable. Skin: No Porokeratosis. No infection or ulcers  Diagnosis:  Onychomycosis, Pain in right toe, pain in left toes  Treatment & Plan Procedures and Treatment: Examination of the right nailbed was performed. Neosporin and dry sterile dressing was applied. Patient was told to continue home soaks and bandaging. Patient to return to the office when necessary for this nail right hallux. Otherwise, he will return in 11 weeks for preventative foot care services   Helane GuntherGregory Coti Burd DPM

## 2016-08-27 ENCOUNTER — Ambulatory Visit (INDEPENDENT_AMBULATORY_CARE_PROVIDER_SITE_OTHER): Payer: Commercial Managed Care - HMO | Admitting: Podiatry

## 2016-08-27 ENCOUNTER — Encounter: Payer: Self-pay | Admitting: Podiatry

## 2016-08-27 DIAGNOSIS — Z09 Encounter for follow-up examination after completed treatment for conditions other than malignant neoplasm: Secondary | ICD-10-CM | POA: Diagnosis not present

## 2016-08-27 DIAGNOSIS — L089 Local infection of the skin and subcutaneous tissue, unspecified: Secondary | ICD-10-CM | POA: Diagnosis not present

## 2016-08-27 DIAGNOSIS — S90821D Blister (nonthermal), right foot, subsequent encounter: Secondary | ICD-10-CM | POA: Diagnosis not present

## 2016-08-27 MED ORDER — CEPHALEXIN 500 MG PO CAPS: 500.0000 mg | ORAL_CAPSULE | Freq: Four times a day (QID) | ORAL | 0 refills | Status: DC

## 2016-08-27 NOTE — Progress Notes (Addendum)
This patient presents the office for a reevaluation of his big toe, right foot. His daughter brings him into the office since the toe is still bleeding. The patient says he is not feeling any pain or discomfort. The daughter says that they have been soaking the toe in soapy soaks in water twice a day and bandaging. The toe. She says the toe has become discolored and there is white tissue noted at the site of the surgery. He previously was seen by myself and found to have a subungual blister with that was infected under the right hallux toenail. This toenail was ultimately removed at the [facility] and home instructions were given. He presents the office today for reevaluation of great toe.  Podiatric Exam: Vascular: dorsalis pedis and posterior tibial pulses are non-palpable. Capillary return is diminished. Cold feet noted.. Skin turgor WNL, bilateral swelling  Sensorium: Normal Semmes Weinstein monofilament test. Normal tactile sensation bilaterally.  Nail Exam: Pt has thick disfigured discolored nails with subungual debris noted bilateral entire nail hallux through fifth toenails except great toe right foot.  There is necrotic tissue noted at the proximal nail fold.  No pus noted.  The nail bed has red healthy granulation tissue. Ulcer Exam: There is no evidence of ulcer or pre-ulcerative changes or infection. Orthopedic Exam: Muscle tone and strength are WNL. No limitations in general ROM. No crepitus or effusions noted. Foot type and digits show no abnormalities. Bony prominences are unremarkable. Skin: No Porokeratosis. No infection or ulcers.  There is purplish and swelling around the nail bed right hallux.    Post op visit with cellulitis noted.   ROV  upon examination of his right hallux. I decided to consult with Dr. Ardelle AntonWagoner.   Dr. Ardelle AntonWagoner evaluated the patient and recommended t he nail bed be  covered with iodosorb  and DSD.  He recommended the patient be prescribed  Cephalexin.  Soak  in epsom salts bid.  RTC 1 week for reevaluation.  If this problem persists, he may be a candidate for vascular studies.  If this condition worsens or becomes very painful, the patient was told to contact this office or go to the Emergency Department at the hospital.  Helane GuntherGregory Jamesmichael Shadd DPM    Helane GuntherGregory Hagen Tidd DPM      Helane GuntherGregory Gloris Shiroma DPM

## 2016-08-28 ENCOUNTER — Telehealth: Payer: Self-pay | Admitting: Sports Medicine

## 2016-08-28 MED ORDER — AMOXICILLIN 400 MG/5ML PO SUSR: 45.0000 mg/kg/d | Freq: Two times a day (BID) | ORAL | 0 refills | Status: AC

## 2016-08-28 NOTE — Telephone Encounter (Signed)
William BernJean daughter of Mr. Chestine SporeClark called stating that her dad had difficulty swallowing the large antibiotic pill. I advised her that I would send po suspension to pharmacy to take instead. Daughter expressed understanding and will go by pharmacy to pick up the medication. -Dr. Marylene LandStover

## 2016-09-03 ENCOUNTER — Telehealth: Payer: Self-pay | Admitting: *Deleted

## 2016-09-03 ENCOUNTER — Ambulatory Visit (INDEPENDENT_AMBULATORY_CARE_PROVIDER_SITE_OTHER): Payer: Commercial Managed Care - HMO | Admitting: Podiatry

## 2016-09-03 ENCOUNTER — Encounter: Payer: Self-pay | Admitting: Podiatry

## 2016-09-03 VITALS — BP 153/108 | HR 69 | Resp 14

## 2016-09-03 DIAGNOSIS — L089 Local infection of the skin and subcutaneous tissue, unspecified: Secondary | ICD-10-CM

## 2016-09-03 DIAGNOSIS — Z09 Encounter for follow-up examination after completed treatment for conditions other than malignant neoplasm: Secondary | ICD-10-CM

## 2016-09-03 DIAGNOSIS — S90821D Blister (nonthermal), right foot, subsequent encounter: Secondary | ICD-10-CM | POA: Diagnosis not present

## 2016-09-03 NOTE — Telephone Encounter (Signed)
Pt's dtr called states they don't remember if pt needs to continue the soaks on his toe. I reviewed LOV and pt is to continue the soaks and apply iodosorb after the soaks.

## 2016-09-03 NOTE — Progress Notes (Signed)
This patient presents the office for a reevaluation of his big toe, right foot. His daughter brings him into the office since the toe is still bleeding. The patient says he is not feeling any pain or discomfort. The daughter says that they have been soaking the toe in soapy soaks in water twice a day and bandaging the toe with iodosorb.  The patient's daughter says there is still bleeding occurring at the site of the nail bed. He was consulted by Dr. Ardelle AntonWagoner who recommended prescribing an antibiotic to help decrease the infection in his toe. He was unable to take the cephalexin. Due to the fact it was a capsule and he was prescribed amoxicillin solution to be taken by mouth. He has finished taking his fluid antibiotic at this time. He presents the office today for an evaluation and treatment  Podiatric Exam: Vascular: dorsalis pedis and posterior tibial pulses are non-palpable. Capillary return is diminished. Cold feet noted.. Skin turgor WNL, bilateral swelling  Sensorium: Normal Semmes Weinstein monofilament test. Normal tactile sensation bilaterally.  Nail Exam: Pt has thick disfigured discolored nails with subungual debris noted bilateral entire nail hallux through fifth toenails except great toe right foot.  There is necrotic tissue noted at the proximal nail fold.  No pus noted.  The nail bed has red healthy granulation tissue. There is a formation of skin desquamation at the lateral aspect of the nail bed of the right great toe. The toe itself has dried through the use of the Iodosorb. The redness and infection has subsided through the taking of the antibiotic Ulcer Exam: There is no evidence of ulcer or pre-ulcerative changes or infection. Orthopedic Exam: Muscle tone and strength are WNL. No limitations in general ROM. No crepitus or effusions noted. Foot type and digits show no abnormalities. Bony prominences are unremarkable. Skin: No Porokeratosis. No infection or ulcers.  Ulcer formation  lateral aspect right hallux nail bed.  No infection noted.   Post op ulcer with no evidence of infection.   ROV  upon examination of his right hallux. I consulted  with Dr. Ardelle AntonWagoner. We both evaluated his right great toe and there is no minimal drainage noted at this time. There is a formation of ulcerated skin lateral nail bed but has good granulation tissue noted at the site. No evidence of any infection is noted. Told the daughter to continue using Iodosorb after soaks. I also told the daughter since she had the antibiotic to place the antibiotic in fluid and allow him to drink the combination to have antibiotic continued to be in his system.  Neosporin/DSD was applied.  If this condition worsens or becomes very painful, the patient was told to contact this office or go to the Emergency Department at the hospital. RTC 2 weeks.     Helane GuntherGregory Tandre Conly DPM      Helane GuntherGregory Mica Releford DPM

## 2016-09-13 ENCOUNTER — Telehealth: Payer: Self-pay | Admitting: *Deleted

## 2016-09-13 NOTE — Telephone Encounter (Signed)
Pt's Dtr, Carney BernJean states her sister who is taking care of pt over the weekend states his urine in pink. I spoke with Carney BernJean and she states pt is still taking the antibiotic which had to be changed to a liquid by our doctor on call. I told her I knew it could cause occasional stomach problems but it would be best if pt stopped the antibiotic at this time and went to the PCP and see what he ordered. Carney BernJean states she will take care of it.

## 2016-09-15 DIAGNOSIS — R319 Hematuria, unspecified: Secondary | ICD-10-CM | POA: Diagnosis not present

## 2016-09-21 ENCOUNTER — Encounter: Payer: Self-pay | Admitting: Podiatry

## 2016-09-21 ENCOUNTER — Ambulatory Visit (INDEPENDENT_AMBULATORY_CARE_PROVIDER_SITE_OTHER): Payer: Commercial Managed Care - HMO | Admitting: Podiatry

## 2016-09-21 VITALS — BP 140/106 | HR 92 | Resp 16

## 2016-09-21 DIAGNOSIS — L97511 Non-pressure chronic ulcer of other part of right foot limited to breakdown of skin: Secondary | ICD-10-CM

## 2016-09-21 NOTE — Progress Notes (Signed)
This patient returns to the office for reevaluation of the big toe, right foot. He shouldn't is brought in by his daughter and his daughter says that his toe is looking better and there is been no bleeding. She says that they have been soaking the foot and bandaging. The toe with Iodosorb. He presents the office today following his nail. Avulsing self avulsing initially and we have been attempting to heal the nailbed area since he presents the office today and has developed a new skin lesion on the inside of the big toe, right foot. There is no drainage. Pus or infection noted and all the ulcerated sites are healing. He presents the office for an evaluation and treatment  Podiatric exam  Vascular dorsalis pedis and posterior tibial pulses are nonpalpable bilateral. Capillary return is diminished: Feet noted. Skin turgor are within normal limits bilateral. Swelling  Sensorium  Normal Semmes Weinstein monofilament wire test. Nail exam  examination of the nailbed of the right hallux does reveal necrotic tissue noted on the dorsum of the nail bed. No redness, swelling or drainage noted  Skin  ulcer formation noted on lateral aspect of the right hallux at the level of the nail bed. There is also an ulcer formation on the medial aspect of the right hallux the nail bed. No redness, swelling or infection noted. The sites are healing at this time  Ulcer right hallux  Return office visit. She will the right hallux reveals the ulcers on the right hallux are healing with healthy granulation tissue noted both medially and laterally. No evidence of any redness, swelling or infection. The nail bed does have a scab-like formation noted at the nailbed which is healing nicely continue to peroxide and soak the toe and the bandage when in shoes. Return to the office in 34 weeks for further evaluation and treatment   Helane GuntherGregory Rosalia Mcavoy DPM

## 2016-09-30 DIAGNOSIS — N481 Balanitis: Secondary | ICD-10-CM | POA: Diagnosis not present

## 2016-10-19 ENCOUNTER — Ambulatory Visit: Payer: Commercial Managed Care - HMO | Admitting: Podiatry

## 2016-11-09 ENCOUNTER — Ambulatory Visit (INDEPENDENT_AMBULATORY_CARE_PROVIDER_SITE_OTHER): Payer: Medicare HMO | Admitting: Podiatry

## 2016-11-09 ENCOUNTER — Encounter: Payer: Self-pay | Admitting: Podiatry

## 2016-11-09 DIAGNOSIS — M79676 Pain in unspecified toe(s): Secondary | ICD-10-CM

## 2016-11-09 DIAGNOSIS — B351 Tinea unguium: Secondary | ICD-10-CM | POA: Diagnosis not present

## 2016-11-09 DIAGNOSIS — L84 Corns and callosities: Secondary | ICD-10-CM

## 2016-11-09 NOTE — Progress Notes (Signed)
Patient ID: [name], male   DOB: Mar 05, 1913, [age] y.o.   MRN: [id number] HPI  Complaint:  Visit Type: Patient returns to my office for continued preventative foot care services. Complaint: Patient states" my nails have grown long and thick and become painful to walk and wear shoes"  . This patient  presents for preventative foot care services. No changes to ROS  Podiatric Exam: Vascular: dorsalis pedis and posterior tibial pulses are non-palpable. Capillary return is diminished. Cold feet noted.. Skin turgor WNL, bilateral swelling  Sensorium: Normal Semmes Weinstein monofilament test. Normal tactile sensation bilaterally.  Nail Exam: Pt has thick disfigured discolored nails with subungual debris noted bilateral entire nail hallux through fifth toenails Ulcer Exam: There is no evidence of ulcer or pre-ulcerative changes or infection. Orthopedic Exam: Muscle tone and strength are WNL. No limitations in general ROM. No crepitus or effusions noted. Foot type and digits show no abnormalities. Bony prominences are unremarkable. Skin: No Porokeratosis. No infection or ulcers.  There is healing area in center right nail bed and healing skin lesion lateral to right hallux toenail.  No signs of redness or infection noted.  Diagnosis:  Onychomycosis, Pain in right toe, pain in left toes  Treatment & Plan Procedures and Treatment: Consent by patient was obtained for treatment procedures. The patient understood the discussion of treatment and procedures well. All questions were answered thoroughly reviewed. Debridement of mycotic and hypertrophic toenails, 1 through 5 bilateral and clearing of subungual debris. No ulceration, no infection noted.  Told to watch right toe but presently toe is healed.. Return Visit-Office Procedure: Patient instructed to return to the office for a follow up visit 3 months for continued evaluation and treatment.    William Nicholson DPM

## 2016-11-19 DIAGNOSIS — N390 Urinary tract infection, site not specified: Secondary | ICD-10-CM | POA: Diagnosis not present

## 2016-11-25 DIAGNOSIS — R159 Full incontinence of feces: Secondary | ICD-10-CM | POA: Diagnosis not present

## 2016-11-25 DIAGNOSIS — R460 Very low level of personal hygiene: Secondary | ICD-10-CM | POA: Diagnosis not present

## 2016-11-25 DIAGNOSIS — R238 Other skin changes: Secondary | ICD-10-CM | POA: Diagnosis not present

## 2016-11-25 DIAGNOSIS — Z9181 History of falling: Secondary | ICD-10-CM | POA: Diagnosis not present

## 2016-11-29 DIAGNOSIS — I482 Chronic atrial fibrillation: Secondary | ICD-10-CM | POA: Diagnosis not present

## 2016-11-29 DIAGNOSIS — E538 Deficiency of other specified B group vitamins: Secondary | ICD-10-CM | POA: Diagnosis not present

## 2016-11-29 DIAGNOSIS — I10 Essential (primary) hypertension: Secondary | ICD-10-CM | POA: Diagnosis not present

## 2016-12-02 DIAGNOSIS — N481 Balanitis: Secondary | ICD-10-CM | POA: Diagnosis not present

## 2016-12-06 DIAGNOSIS — I482 Chronic atrial fibrillation: Secondary | ICD-10-CM | POA: Diagnosis not present

## 2016-12-06 DIAGNOSIS — D696 Thrombocytopenia, unspecified: Secondary | ICD-10-CM | POA: Diagnosis not present

## 2016-12-06 DIAGNOSIS — E538 Deficiency of other specified B group vitamins: Secondary | ICD-10-CM | POA: Diagnosis not present

## 2016-12-06 DIAGNOSIS — N481 Balanitis: Secondary | ICD-10-CM | POA: Diagnosis not present

## 2017-02-06 ENCOUNTER — Observation Stay (HOSPITAL_COMMUNITY): Payer: Medicare HMO

## 2017-02-06 ENCOUNTER — Emergency Department (HOSPITAL_COMMUNITY): Payer: Medicare HMO

## 2017-02-06 ENCOUNTER — Inpatient Hospital Stay (HOSPITAL_COMMUNITY)
Admission: EM | Admit: 2017-02-06 | Discharge: 2017-02-08 | DRG: 071 | Disposition: A | Discharge status: Home / Self Care | Payer: Medicare HMO | Attending: Internal Medicine | Admitting: Internal Medicine

## 2017-02-06 ENCOUNTER — Encounter (HOSPITAL_COMMUNITY): Payer: Self-pay

## 2017-02-06 DIAGNOSIS — G9341 Metabolic encephalopathy: Secondary | ICD-10-CM | POA: Diagnosis not present

## 2017-02-06 DIAGNOSIS — R1310 Dysphagia, unspecified: Secondary | ICD-10-CM | POA: Diagnosis present

## 2017-02-06 DIAGNOSIS — I4891 Unspecified atrial fibrillation: Secondary | ICD-10-CM | POA: Diagnosis present

## 2017-02-06 DIAGNOSIS — R4182 Altered mental status, unspecified: Secondary | ICD-10-CM | POA: Diagnosis not present

## 2017-02-06 DIAGNOSIS — N179 Acute kidney failure, unspecified: Secondary | ICD-10-CM | POA: Diagnosis not present

## 2017-02-06 DIAGNOSIS — I4581 Long QT syndrome: Secondary | ICD-10-CM | POA: Diagnosis present

## 2017-02-06 DIAGNOSIS — I10 Essential (primary) hypertension: Secondary | ICD-10-CM

## 2017-02-06 DIAGNOSIS — F0391 Unspecified dementia with behavioral disturbance: Secondary | ICD-10-CM | POA: Diagnosis present

## 2017-02-06 DIAGNOSIS — R451 Restlessness and agitation: Secondary | ICD-10-CM | POA: Diagnosis not present

## 2017-02-06 DIAGNOSIS — Z79899 Other long term (current) drug therapy: Secondary | ICD-10-CM

## 2017-02-06 DIAGNOSIS — S30850A Superficial foreign body of lower back and pelvis, initial encounter: Secondary | ICD-10-CM | POA: Diagnosis not present

## 2017-02-06 DIAGNOSIS — D696 Thrombocytopenia, unspecified: Secondary | ICD-10-CM | POA: Diagnosis not present

## 2017-02-06 DIAGNOSIS — R410 Disorientation, unspecified: Secondary | ICD-10-CM

## 2017-02-06 DIAGNOSIS — I6789 Other cerebrovascular disease: Secondary | ICD-10-CM | POA: Diagnosis not present

## 2017-02-06 DIAGNOSIS — Z7901 Long term (current) use of anticoagulants: Secondary | ICD-10-CM

## 2017-02-06 DIAGNOSIS — E860 Dehydration: Secondary | ICD-10-CM | POA: Diagnosis not present

## 2017-02-06 DIAGNOSIS — Z681 Body mass index (BMI) 19 or less, adult: Secondary | ICD-10-CM | POA: Diagnosis not present

## 2017-02-06 DIAGNOSIS — R402441 Other coma, without documented Glasgow coma scale score, or with partial score reported, in the field [EMT or ambulance]: Secondary | ICD-10-CM | POA: Diagnosis not present

## 2017-02-06 DIAGNOSIS — G9340 Encephalopathy, unspecified: Secondary | ICD-10-CM | POA: Diagnosis not present

## 2017-02-06 DIAGNOSIS — N400 Enlarged prostate without lower urinary tract symptoms: Secondary | ICD-10-CM | POA: Diagnosis not present

## 2017-02-06 DIAGNOSIS — F419 Anxiety disorder, unspecified: Secondary | ICD-10-CM | POA: Diagnosis present

## 2017-02-06 DIAGNOSIS — I119 Hypertensive heart disease without heart failure: Secondary | ICD-10-CM | POA: Diagnosis present

## 2017-02-06 DIAGNOSIS — Z139 Encounter for screening, unspecified: Secondary | ICD-10-CM | POA: Diagnosis not present

## 2017-02-06 DIAGNOSIS — F03918 Unspecified dementia, unspecified severity, with other behavioral disturbance: Secondary | ICD-10-CM

## 2017-02-06 DIAGNOSIS — I482 Chronic atrial fibrillation: Secondary | ICD-10-CM | POA: Diagnosis not present

## 2017-02-06 DIAGNOSIS — F4489 Other dissociative and conversion disorders: Secondary | ICD-10-CM | POA: Diagnosis not present

## 2017-02-06 LAB — CBC WITH DIFFERENTIAL/PLATELET
BASOS ABS: 0 10*3/uL (ref 0.0–0.1)
BASOS PCT: 0 %
EOS ABS: 0.1 10*3/uL (ref 0.0–0.7)
EOS PCT: 1 %
HCT: 36.5 % — ABNORMAL LOW (ref 39.0–52.0)
Hemoglobin: 12.8 g/dL — ABNORMAL LOW (ref 13.0–17.0)
LYMPHS PCT: 30 %
Lymphs Abs: 1.3 10*3/uL (ref 0.7–4.0)
MCH: 35.4 pg — AB (ref 26.0–34.0)
MCHC: 35.1 g/dL (ref 30.0–36.0)
MCV: 100.8 fL — AB (ref 78.0–100.0)
MONO ABS: 0.3 10*3/uL (ref 0.1–1.0)
Monocytes Relative: 7 %
Neutro Abs: 2.6 10*3/uL (ref 1.7–7.7)
Neutrophils Relative %: 62 %
PLATELETS: 111 10*3/uL — AB (ref 150–400)
RBC: 3.62 MIL/uL — ABNORMAL LOW (ref 4.22–5.81)
RDW: 12.1 % (ref 11.5–15.5)
WBC: 4.2 10*3/uL (ref 4.0–10.5)

## 2017-02-06 LAB — BASIC METABOLIC PANEL
ANION GAP: 5 (ref 5–15)
BUN: 32 mg/dL — AB (ref 6–20)
CALCIUM: 8.9 mg/dL (ref 8.9–10.3)
CO2: 25 mmol/L (ref 22–32)
CREATININE: 1.25 mg/dL — AB (ref 0.61–1.24)
Chloride: 110 mmol/L (ref 101–111)
GFR calc Af Amer: 52 mL/min — ABNORMAL LOW (ref >60)
GFR, EST NON AFRICAN AMERICAN: 45 mL/min — AB (ref >60)
GLUCOSE: 96 mg/dL (ref 65–99)
Potassium: 3.9 mmol/L (ref 3.5–5.1)
Sodium: 140 mmol/L (ref 135–145)

## 2017-02-06 LAB — VITAMIN B12: Vitamin B-12: 385 pg/mL (ref 180–914)

## 2017-02-06 LAB — I-STAT CHEM 8, ED
BUN: 30 mg/dL — AB (ref 6–20)
CALCIUM ION: 1.22 mmol/L (ref 1.15–1.40)
CREATININE: 1.2 mg/dL (ref 0.61–1.24)
Chloride: 110 mmol/L (ref 101–111)
Glucose, Bld: 91 mg/dL (ref 65–99)
HEMATOCRIT: 35 % — AB (ref 39.0–52.0)
HEMOGLOBIN: 11.9 g/dL — AB (ref 13.0–17.0)
Potassium: 3.9 mmol/L (ref 3.5–5.1)
SODIUM: 143 mmol/L (ref 135–145)
TCO2: 25 mmol/L (ref 0–100)

## 2017-02-06 LAB — RAPID URINE DRUG SCREEN, HOSP PERFORMED
Amphetamines: NOT DETECTED
BARBITURATES: NOT DETECTED
Benzodiazepines: NOT DETECTED
Cocaine: NOT DETECTED
Opiates: NOT DETECTED
Tetrahydrocannabinol: NOT DETECTED

## 2017-02-06 LAB — URINALYSIS, ROUTINE W REFLEX MICROSCOPIC
BACTERIA UA: NONE SEEN
Bilirubin Urine: NEGATIVE
Glucose, UA: NEGATIVE mg/dL
Hgb urine dipstick: NEGATIVE
KETONES UR: NEGATIVE mg/dL
Leukocytes, UA: NEGATIVE
Nitrite: NEGATIVE
PROTEIN: 30 mg/dL — AB
Specific Gravity, Urine: 1.018 (ref 1.005–1.030)
Squamous Epithelial / LPF: NONE SEEN
pH: 6 (ref 5.0–8.0)

## 2017-02-06 LAB — AMMONIA: AMMONIA: 17 umol/L (ref 9–35)

## 2017-02-06 LAB — I-STAT CG4 LACTIC ACID, ED: Lactic Acid, Venous: 1.2 mmol/L (ref 0.5–1.9)

## 2017-02-06 LAB — PROTIME-INR
INR: 2.11
PROTHROMBIN TIME: 24 s — AB (ref 11.4–15.2)

## 2017-02-06 MED ORDER — FINASTERIDE 5 MG PO TABS
5.0000 mg | ORAL_TABLET | Freq: Every morning | ORAL | Status: DC
  Administered 2017-02-08: 5 mg via ORAL
  Filled 2017-02-06 (×3): qty 1

## 2017-02-06 MED ORDER — HALOPERIDOL LACTATE 5 MG/ML IJ SOLN: 2.0000 mg | Freq: Four times a day (QID) | INTRAMUSCULAR | Status: DC | PRN

## 2017-02-06 MED ORDER — ONDANSETRON HCL 4 MG PO TABS: 4.0000 mg | ORAL_TABLET | Freq: Four times a day (QID) | ORAL | Status: DC | PRN

## 2017-02-06 MED ORDER — ACETAMINOPHEN 650 MG RE SUPP: 650.0000 mg | Freq: Four times a day (QID) | RECTAL | Status: DC | PRN

## 2017-02-06 MED ORDER — HALOPERIDOL LACTATE 5 MG/ML IJ SOLN
INTRAMUSCULAR | Status: AC
  Filled 2017-02-06: qty 1

## 2017-02-06 MED ORDER — METOPROLOL TARTRATE 5 MG/5ML IV SOLN
5.0000 mg | INTRAVENOUS | Status: DC
  Administered 2017-02-06 – 2017-02-07 (×7): 5 mg via INTRAVENOUS
  Filled 2017-02-06 (×8): qty 5

## 2017-02-06 MED ORDER — HALOPERIDOL LACTATE 5 MG/ML IJ SOLN: 4.0000 mg | Freq: Four times a day (QID) | INTRAMUSCULAR | Status: DC | PRN

## 2017-02-06 MED ORDER — LORAZEPAM 2 MG/ML IJ SOLN
1.0000 mg | Freq: Once | INTRAMUSCULAR | Status: AC
  Administered 2017-02-06: 1 mg via INTRAVENOUS
  Filled 2017-02-06: qty 1

## 2017-02-06 MED ORDER — ORAL CARE MOUTH RINSE
15.0000 mL | Freq: Two times a day (BID) | OROMUCOSAL | Status: DC
  Administered 2017-02-07 (×2): 15 mL via OROMUCOSAL

## 2017-02-06 MED ORDER — SODIUM CHLORIDE 0.9 % IV SOLN
INTRAVENOUS | Status: DC
  Administered 2017-02-06 (×2): via INTRAVENOUS

## 2017-02-06 MED ORDER — ACETAMINOPHEN 325 MG PO TABS: 650.0000 mg | ORAL_TABLET | Freq: Four times a day (QID) | ORAL | Status: DC | PRN

## 2017-02-06 MED ORDER — BISACODYL 10 MG RE SUPP: 10.0000 mg | Freq: Every day | RECTAL | Status: DC | PRN

## 2017-02-06 MED ORDER — LORAZEPAM 2 MG/ML IJ SOLN: 1.0000 mg | Freq: Once | INTRAMUSCULAR | Status: DC

## 2017-02-06 MED ORDER — ONDANSETRON HCL 4 MG/2ML IJ SOLN: 4.0000 mg | Freq: Four times a day (QID) | INTRAMUSCULAR | Status: DC | PRN

## 2017-02-06 MED ORDER — RIVAROXABAN 15 MG PO TABS
15.0000 mg | ORAL_TABLET | Freq: Every day | ORAL | Status: DC
  Administered 2017-02-07: 15 mg via ORAL
  Filled 2017-02-06 (×2): qty 1

## 2017-02-06 MED ORDER — SODIUM CHLORIDE 0.9 % IV SOLN
INTRAVENOUS | Status: DC
  Administered 2017-02-06 – 2017-02-07 (×2): via INTRAVENOUS

## 2017-02-06 MED ORDER — MAGNESIUM CITRATE PO SOLN: 1.0000 | Freq: Once | ORAL | Status: DC | PRN

## 2017-02-06 MED ORDER — ONDANSETRON HCL 4 MG/2ML IJ SOLN: 4.0000 mg | Freq: Three times a day (TID) | INTRAMUSCULAR | Status: DC | PRN

## 2017-02-06 MED ORDER — SENNOSIDES-DOCUSATE SODIUM 8.6-50 MG PO TABS: 1.0000 | ORAL_TABLET | Freq: Every evening | ORAL | Status: DC | PRN

## 2017-02-06 MED ORDER — CHLORHEXIDINE GLUCONATE 0.12 % MT SOLN
15.0000 mL | Freq: Two times a day (BID) | OROMUCOSAL | Status: DC
  Administered 2017-02-07 – 2017-02-08 (×2): 15 mL via OROMUCOSAL
  Filled 2017-02-06 (×4): qty 15

## 2017-02-06 NOTE — ED Notes (Signed)
Bed: ZO10 Expected date:  Expected time:  Means of arrival:  Comments: EMS 83 yo male confused and restless/recent UTI

## 2017-02-06 NOTE — ED Notes (Signed)
No respiratory or acute distress noted restless in bed family at bedside call light in reach.

## 2017-02-06 NOTE — ED Triage Notes (Signed)
States for a couple days per family foul smelling urine and confusion, no fever noted.

## 2017-02-06 NOTE — ED Notes (Signed)
This Clinical research associate spoke with Dr. Clyde Lundborg and he states patient may go to floor now.

## 2017-02-06 NOTE — ED Provider Notes (Signed)
MRI tech requested additional sedation for MRI. She reports patient is reaching and agitated. Patient is already admitted to hospitalist service. I briefly discussed the patient's history with the MRI tech and the patient's daughter. He is here for agitation and confusion. His daughter reports that he did not improve after a medication started by his PCP on Friday. MRI tech reports that the last milligram Ativan he had been given seems to have exacerbated his reaching and confusion. At this time, I will defer to admitting service for degree of sedation they wish for obtaining an MRI on this 83 year old gentleman with confusion and agitation.   Arby Barrette, MD 02/06/17 [phone]

## 2017-02-06 NOTE — Progress Notes (Signed)
This is a no charge note  Pending admission per Dr. Mora Bellman  While 83-year-old male with  past medical history of atrial fibrillation on Xarelto, anxiety, hypertension, BPH, who presents with altered mental status for 2 days. No focal neurological signs per ED physician. Urinalysis negative. No leukocytosis or fever, INR 2.11, lactate 1.20. CT scan was done, but read yet. Pt is accepted to tele bed for obs. EDP will order MRI of brain to r/o stroke. Will get trop x 3.  Lorretta Harp, MD  [facility] Pager [phone]  If 7PM-7AM, please contact night-coverage [email or web address] Password [facility] 02/06/2017, 4:49 AM

## 2017-02-06 NOTE — ED Provider Notes (Addendum)
[facility]-EMERGENCY DEPT Provider Note   CSN: [id number] Arrival date & time: 02/06/17  0257  By signing my name below, I, Octavia Heir, attest that this documentation has been prepared under the direction and in the presence of Tomasita Crumble, MD.  Electronically Signed: Octavia Heir, ED Scribe. 02/06/17. 3:56 AM.    History   Chief Complaint Chief Complaint  Patient presents with  . Altered Mental Status   LEVEL V CAVEAT: HPI and ROS limited due to altered mental status  The history is provided by a relative and a caregiver. No language interpreter was used.   HPI Comments: William Nicholson is a 83 y.o. male brought in by ambulance, who presents to the Emergency Department presenting with AMS x 2 days. Per family, pt has been more confused for the past 2 days with difficulty sleeping and increased agitation. Daughters note pt is more agitated than his normal baseline and has been more confused stating "he was did not know where his food was and he was pulling the curtains down". Pt is normally able to ambulate with a walker and is able to feed himself but he has not been able to recently. Pt lives at home with his family who takes care of him. He does not have a hx of UTI. He has not had any recent falls or head injuries. Daughters deny fever, cough, rhinorrhea, congestion, diaphoresis, vomiting, and diarrhea.  Past Medical History:  Diagnosis Date  . Hypertension     There are no active problems to display for this patient.   History reviewed. No pertinent surgical history.     Home Medications    Prior to Admission medications   Medication Sig Start Date End Date Taking? Authorizing Provider  finasteride (PROSCAR) 5 MG tablet Take 5 mg by mouth daily. 02/16/16   Historical Provider, MD  levofloxacin (LEVAQUIN) 750 MG tablet Take 1 tablet (750 mg total) by mouth daily. X 7 days 12/14/15   Geoffery Lyons, MD  metoprolol succinate (TOPROL-XL) 100 MG 24 hr tablet Take 100 mg by mouth  daily. 11/09/14   Historical Provider, MD  Rivaroxaban (XARELTO) 15 MG TABS tablet Take 15 mg by mouth daily.    Historical Provider, MD    Family History History reviewed. No pertinent family history.  Social History Social History  Substance Use Topics  . Smoking status: Never Smoker  . Smokeless tobacco: Never Used  . Alcohol use No     Allergies   Patient has no known allergies.   Review of Systems Review of Systems  LEVEL V CAVEAT: HPI and ROS limited due to altered mental status.  Physical Exam Updated Vital Signs BP (!) 158/101 (BP Location: Left Arm)   Pulse 98   Temp 98.1 F (36.7 C) (Axillary)   Resp 18   Ht  (1.88 m)   Wt 140 lb (63.5 kg)   SpO2 99%   BMI 17.97 kg/m   Physical Exam  Constitutional: Vital signs are normal. He appears well-developed and well-nourished.  Non-toxic appearance. He does not appear ill. No distress.  HENT:  Head: Normocephalic and atraumatic.  Nose: Nose normal.  Mouth/Throat: Oropharynx is clear and moist. No oropharyngeal exudate.  Eyes: Conjunctivae and EOM are normal. Pupils are equal, round, and reactive to light. No scleral icterus.  Neck: Normal range of motion. Neck supple. No tracheal deviation, no edema, no erythema and normal range of motion present. No thyroid mass and no thyromegaly present.  Cardiovascular: Normal rate, regular  rhythm, S1 normal, S2 normal, normal heart sounds, intact distal pulses and normal pulses.  Exam reveals no gallop and no friction rub.   No murmur heard. Pulmonary/Chest: Effort normal and breath sounds normal. No respiratory distress. He has no wheezes. He has no rhonchi. He has no rales.  Abdominal: Soft. Normal appearance and bowel sounds are normal. He exhibits no distension, no ascites and no mass. There is no hepatosplenomegaly. There is no tenderness. There is no rebound, no guarding and no CVA tenderness.  Musculoskeletal: Normal range of motion. He exhibits no edema or  tenderness.  Lymphadenopathy:    He has no cervical adenopathy.  Neurological: He is alert. He has normal strength. No cranial nerve deficit or sensory deficit.  Skin: Skin is warm, dry and intact. No petechiae and no rash noted. He is not diaphoretic. No erythema. No pallor.  Nursing note and vitals reviewed.    ED Treatments / Results  DIAGNOSTIC STUDIES: Oxygen Saturation is 99% on RA, normal by my interpretation.  COORDINATION OF CARE:  3:56 AM Discussed treatment plan with pt at bedside and pt agreed to plan.  Labs (all labs ordered are listed, but only abnormal results are displayed) Labs Reviewed  URINE CULTURE  CBC WITH DIFFERENTIAL/PLATELET  BASIC METABOLIC PANEL  URINALYSIS, ROUTINE W REFLEX MICROSCOPIC  PROTIME-INR  I-STAT CG4 LACTIC ACID, ED  CBG MONITORING, ED    EKG  EKG Interpretation None       Radiology No results found.  Procedures Procedures (including critical care time)  Medications Ordered in ED Medications - No data to display   Initial Impression / Assessment and Plan / ED Course  I have reviewed the triage vital signs and the nursing notes.  Pertinent labs & imaging results that were available during my care of the patient were reviewed by me and considered in my medical decision making (see chart for details).     Patient presents to the ED for AMS.  I obtained labs, infectious work up, and CT head for evaluation.    Work up is negative.  Daughters states he continues to be altered and this is unusual for him. Dr. Zane Herald accept for further care. I ordered MRI head on his behalf.  Will admit to tele for further care.    Final Clinical Impressions(s) / ED Diagnoses   Final diagnoses:  None   I personally performed the services described in this documentation, which was scribed in my presence. The recorded information has been reviewed and is accurate.    New Prescriptions New Prescriptions   No medications on file     Tomasita Crumble, MD 02/06/17 1610    Tomasita Crumble, MD 02/19/17 0020

## 2017-02-06 NOTE — H&P (Addendum)
History and Physical    William Nicholson ZOX:[id number] DOB: 1913/07/14 DOA: 02/06/2017   PCP: Georgianne Fick, MD   Patient coming from:  Home    Chief Complaint: Confusion   HPI: William Nicholson is a 83 y.o. male with medical history significant for HTN, Atrial fibrillation brought to the ED with 3 day history of confusion. The patient was in his usual state of health until last Friday, when he was noted by family to be confused/ agitation and decreased oral intake. Daughter is the main historian as patient unable to provide information.  No apparent focal complaints at this point time including dysuria, or frequency, patient has some incontinenence and wears diapers. No chest pain, worsening shortness of breath,syncope, or presyncope. back pain, rash, fevers, cough, headache, or neck stiffness. No recent URI. No ETOH use.  He has 2 daughters who alternate staying with him in his home and caring for him. His daughters give him his medication and there was not change in medications prior to the patient becoming confused.  His sister called the PCP about the agitation and was given a prescription for Ativan. She used it at night last night. Not sure if it helped as the sister who spent the night with him is not here. In the ER he has been given Ativan for an MRI and has become more agitated.   ED Course:  BP (!) 153/104   Pulse (!) 109   Temp 97.9 F (36.6 C) (Oral)   Resp 15   Ht  (1.88 m)   Wt 63.5 kg (140 lb)   SpO2 99%   BMI 17.97 kg/m    Received IVF  CT head is negative for acute intracranial abnormalities. MRI brain is pending. No seizures noted Afebrile.  WBC 4.2,  Glu 91  Lactic acid 1.2 VSS. O2 normal in RA   UA reassuring.   CXR NAD .  Plts 11k Receiving IVF 75 cc/h   Cr was 1.25, now normalizing. GFR 50's to 60   Review of Systems: As per HPI otherwise 10 point review of systems negative.   Past Medical History:  Diagnosis Date  . Hypertension     History  reviewed. No pertinent surgical history.  Social History Social History   Social History  . Marital status: Single    Spouse name: N/A  . Number of children: N/A  . Years of education: N/A   Occupational History  . Not on file.   Social History Main Topics  . Smoking status: Never Smoker  . Smokeless tobacco: Never Used  . Alcohol use No  . Drug use: Unknown  . Sexual activity: Not on file   Other Topics Concern  . Not on file   Social History Narrative  . No narrative on file     No Known Allergies  History reviewed. No pertinent family history.    Prior to Admission medications   Medication Sig Start Date End Date Taking? Authorizing Provider  finasteride (PROSCAR) 5 MG tablet Take 5 mg by mouth every morning.  02/16/16  Yes Historical Provider, MD  ketoconazole (NIZORAL) 2 % cream Apply 1 application topically daily as needed for irritation.  01/17/17  Yes Historical Provider, MD  LORazepam (ATIVAN) 0.5 MG tablet Take 0.5 mg by mouth every 6 (six) hours as needed for anxiety.  02/03/17  Yes Historical Provider, MD  metoprolol succinate (TOPROL-XL) 100 MG 24 hr tablet Take 100 mg by mouth every morning.  11/09/14  Yes  Historical Provider, MD  Rivaroxaban (XARELTO) 15 MG TABS tablet Take 15 mg by mouth every evening.    Yes Historical Provider, MD    Physical Exam:  Vitals:   02/06/17 0427 02/06/17 0500 02/06/17 0542 02/06/17 0755  BP:  (!) 132/110 (!) 138/108 (!) 153/104  Pulse:   (!) 101 (!) 109  Resp:  Temp: 97.9 F (36.6 C)  97.9 F (36.6 C)   TempSrc: Rectal  Oral   SpO2:   94% 99%  Weight:      Height:       Constitutional: NAD, calm, comfortable, appears and mildly restless, constantly turning and lifting his head. He is non-verbal and is not acknowledging my presence or following command Eyes: known glaucoma, lids and conjunctivae normal ENMT: Mucous membranes are moist, without exudate or lesions  Neck: normal, supple, no masses, no  thyromegaly Respiratory: clear to auscultation bilaterally, no wheezing, no crackles. Normal respiratory effort  Cardiovascular: Irregularly irregular rate and rhythm, no murmurs, rubs or gallops. No extremity edema. 2+ pedal pulses. No carotid bruits.  Abdomen: Soft, non tender, No hepatosplenomegaly. Bowel sounds positive.  Musculoskeletal: no clubbing / cyanosis. Moves all extremities.   Skin: no jaundice, No lesions. Neurologic: Sensation intact  Strength equal in all extremities Psychiatric  Patient is confused      Labs on Admission: I have personally reviewed following labs and imaging studies  CBC:  Recent Labs Lab 02/06/17 0329 02/06/17 0341  WBC 4.2  --   NEUTROABS 2.6  --   HGB 12.8* 11.9*  HCT 36.5* 35.0*  MCV 100.8*  --   PLT 111*  --     Basic Metabolic Panel:  Recent Labs Lab 02/06/17 0329 02/06/17 0341  NA 140 143  K 3.9 3.9  CL 110 110  CO2 25  --   GLUCOSE 96 91  BUN 32* 30*  CREATININE 1.25* 1.20  CALCIUM 8.9  --     GFR: Estimated Creatinine Clearance: 27.9 mL/min (by C-G formula based on SCr of 1.2 mg/dL).  Liver Function Tests: No results for input(s): AST, ALT, ALKPHOS, BILITOT, PROT, ALBUMIN in the last 168 hours. No results for input(s): LIPASE, AMYLASE in the last 168 hours. No results for input(s): AMMONIA in the last 168 hours.  Coagulation Profile:  Recent Labs Lab 02/06/17 0329  INR 2.11    Cardiac Enzymes: No results for input(s): CKTOTAL, CKMB, CKMBINDEX, TROPONINI in the last 168 hours.  BNP (last 3 results) No results for input(s): PROBNP in the last 8760 hours.  HbA1C: No results for input(s): HGBA1C in the last 72 hours.  CBG: No results for input(s): GLUCAP in the last 168 hours.  Lipid Profile: No results for input(s): CHOL, HDL, LDLCALC, TRIG, CHOLHDL, LDLDIRECT in the last 72 hours.  Thyroid Function Tests: No results for input(s): TSH, T4TOTAL, FREET4, T3FREE, THYROIDAB in the last 72 hours.  Anemia  Panel: No results for input(s): VITAMINB12, FOLATE, FERRITIN, TIBC, IRON, RETICCTPCT in the last 72 hours.  Urine analysis:    Component Value Date/Time   COLORURINE YELLOW 02/06/2017 0334   APPEARANCEUR CLEAR 02/06/2017 0334   LABSPEC 1.018 02/06/2017 0334   PHURINE 6.0 02/06/2017 0334   GLUCOSEU NEGATIVE 02/06/2017 0334   HGBUR NEGATIVE 02/06/2017 0334   BILIRUBINUR NEGATIVE 02/06/2017 0334   KETONESUR NEGATIVE 02/06/2017 0334   PROTEINUR 30 (A) 02/06/2017 0334   UROBILINOGEN 0.2 10/15/2010 1614   NITRITE NEGATIVE 02/06/2017 0334   LEUKOCYTESUR NEGATIVE 02/06/2017 0334  Sepsis Labs: (procalcitonin:4,lacticidven:4) )No results found for this or any previous visit (from the past 240 hour(s)).   Radiological Exams on Admission: Dg Chest 2 View  Result Date: 02/06/2017 CLINICAL DATA:  Foul smelling urine with confusion EXAM: CHEST  2 VIEW COMPARISON:  03/16/2016 FINDINGS: Hyperinflation with mild coarse interstitial opacities likely chronic. No pleural effusion or focal infiltrate. There is mild cardiomegaly with atherosclerosis. No pneumothorax. IMPRESSION: 1. Hyperinflation without focal infiltrate 2. Cardiomegaly without overt edema Electronically Signed   By: Jasmine Pang M.D.   On: 02/06/2017 03:58   Ct Head Wo Contrast  Result Date: 02/06/2017 CLINICAL DATA:  Acute onset of altered mental status. Initial encounter. EXAM: CT HEAD WITHOUT CONTRAST TECHNIQUE: Contiguous axial images were obtained from the base of the skull through the vertex without intravenous contrast. COMPARISON:  CT of the head performed 03/16/2016 FINDINGS: Brain: No evidence of acute infarction, hemorrhage, hydrocephalus, extra-axial collection or mass lesion/mass effect. Prominence of the ventricles and sulci reflects moderately severe age-appropriate cortical volume loss. Cerebellar atrophy is noted. Scattered periventricular and subcortical white matter change likely reflects small vessel  ischemic microangiopathy. Chronic ischemic change is noted at the basal ganglia bilaterally. The brainstem and fourth ventricle are within normal limits. The cerebral hemispheres demonstrate grossly normal gray-white differentiation. No mass effect or midline shift is seen. Vascular: No hyperdense vessel or unexpected calcification. Skull: There is no evidence of fracture; visualized osseous structures are unremarkable in appearance. Sinuses/Orbits: The orbits are within normal limits. The paranasal sinuses and mastoid air cells are well-aerated. Other: No significant soft tissue abnormalities are seen. IMPRESSION: 1. No acute intracranial pathology seen on CT. 2. Moderately severe age-appropriate cortical volume loss. 3. Scattered small vessel ischemic microangiopathy. 4. Chronic ischemic change at the basal ganglia bilaterally. Electronically Signed   By: Roanna Raider M.D.   On: 02/06/2017 04:43    EKG: Independently reviewed.  Assessment/Plan Active Problems:   Acute encephalopathy   Hypertension   BPH (benign prostatic hyperplasia)   Atrial fibrillation (HCC)   Thrombocytopenia (HCC)   AKI (acute kidney injury) (HCC)   Acute metabolic encephalopathy in the setting of chronic dementia,  unclear etiology. CT head is negative for acute intracranial abnormalities. MRI brain is pending but Ativan and Haldol have made him more agitated and so MRI has been cancelled.  I have called MRI at [facility] and they do not do MRIs with conscious sedation on the weekend. The schedule unfortunately is booked up for tomorrow.  No seizures noted Afebrile. WBC 4.2,  Glu 91  Lactic acid 1.2 VSS. O2 normal in RA  UA reassuring.  CXR NAD . Receiving IVF 75 cc/h   Admit to obs tele Neuro check Urine Drug Screen BCx  BMET and CBG monitoring Ammonia levels, if elevated Start Lactulose    QTc Prolonged - follow on telemetry - may need to give him antipsychotics and will need to closely monitor QTc   Mild  Renal insufficiency - baseline Cr about 1 and now is 1.2 - slow IV hydration   Hypertension BP 153/104   Pulse  109    Hold  Oral Lopressor Initiate Lopressor IV 5 mg IV q 4 h-  hold for BP 120/80 P 60   Atrial Fibrillation CHA2DS2-VASc score 2 , on anticoagulation with Xarelto. No acute issues at this time. Tn pending   Rate controlled Continue meds  Benign prostate hypertrophy Continue Proscar   Thrombocytopenia in the setting of Xarelto and possible infection. No acute bleeding  issues. Current Plt 111k  No transfusion is indicated at this time Monitor counts closely Transfuse 1 unit of platelets if count is less or equal than 10,000 or 20,000 if the patient is acutely bleeding Hold Xarelto if  platelets drop to less than 50,000  DVT prophylaxis:   Xarelto   Code Status:   Full     Family Communication:  Discussed with daughter Disposition Plan: Expect patient to be discharged to home after condition improves Consults called:    None Admission status:Tele Obs   Marcos Eke, PA-C [facility]   02/06/2017, 8:21 AM   I have examined the patient, spoken with his daughter and reviewed the chart. I have modified the above note and agree with it.  Low possibility of CVA as he is on Xarelto. If MRI not able to be done, can repeat a head CT tomorrow AM if he remains confused.    Calvert Cantor, MD

## 2017-02-06 NOTE — ED Notes (Signed)
Consult  Provider at bedside. 

## 2017-02-07 ENCOUNTER — Observation Stay (HOSPITAL_COMMUNITY): Payer: Medicare HMO

## 2017-02-07 DIAGNOSIS — G9340 Encephalopathy, unspecified: Secondary | ICD-10-CM

## 2017-02-07 DIAGNOSIS — R1310 Dysphagia, unspecified: Secondary | ICD-10-CM | POA: Diagnosis present

## 2017-02-07 DIAGNOSIS — Z681 Body mass index (BMI) 19 or less, adult: Secondary | ICD-10-CM | POA: Diagnosis not present

## 2017-02-07 DIAGNOSIS — G9341 Metabolic encephalopathy: Secondary | ICD-10-CM | POA: Diagnosis present

## 2017-02-07 DIAGNOSIS — F419 Anxiety disorder, unspecified: Secondary | ICD-10-CM | POA: Diagnosis present

## 2017-02-07 DIAGNOSIS — R410 Disorientation, unspecified: Secondary | ICD-10-CM | POA: Diagnosis not present

## 2017-02-07 DIAGNOSIS — I10 Essential (primary) hypertension: Secondary | ICD-10-CM

## 2017-02-07 DIAGNOSIS — I4581 Long QT syndrome: Secondary | ICD-10-CM | POA: Diagnosis present

## 2017-02-07 DIAGNOSIS — Z7901 Long term (current) use of anticoagulants: Secondary | ICD-10-CM | POA: Diagnosis not present

## 2017-02-07 DIAGNOSIS — E860 Dehydration: Secondary | ICD-10-CM | POA: Diagnosis present

## 2017-02-07 DIAGNOSIS — I119 Hypertensive heart disease without heart failure: Secondary | ICD-10-CM | POA: Diagnosis present

## 2017-02-07 DIAGNOSIS — F0391 Unspecified dementia with behavioral disturbance: Secondary | ICD-10-CM | POA: Diagnosis present

## 2017-02-07 DIAGNOSIS — R451 Restlessness and agitation: Secondary | ICD-10-CM | POA: Diagnosis present

## 2017-02-07 DIAGNOSIS — I4891 Unspecified atrial fibrillation: Secondary | ICD-10-CM | POA: Diagnosis present

## 2017-02-07 DIAGNOSIS — I482 Chronic atrial fibrillation: Secondary | ICD-10-CM | POA: Diagnosis not present

## 2017-02-07 DIAGNOSIS — N179 Acute kidney failure, unspecified: Secondary | ICD-10-CM | POA: Diagnosis present

## 2017-02-07 DIAGNOSIS — Z79899 Other long term (current) drug therapy: Secondary | ICD-10-CM | POA: Diagnosis not present

## 2017-02-07 DIAGNOSIS — D696 Thrombocytopenia, unspecified: Secondary | ICD-10-CM | POA: Diagnosis present

## 2017-02-07 DIAGNOSIS — N400 Enlarged prostate without lower urinary tract symptoms: Secondary | ICD-10-CM

## 2017-02-07 LAB — URINE CULTURE: CULTURE: NO GROWTH

## 2017-02-07 LAB — COMPREHENSIVE METABOLIC PANEL
ALBUMIN: 3.3 g/dL — AB (ref 3.5–5.0)
ALT: 14 U/L — ABNORMAL LOW (ref 17–63)
ANION GAP: 8 (ref 5–15)
AST: 18 U/L (ref 15–41)
Alkaline Phosphatase: 67 U/L (ref 38–126)
BUN: 22 mg/dL — ABNORMAL HIGH (ref 6–20)
CO2: 22 mmol/L (ref 22–32)
Calcium: 8.8 mg/dL — ABNORMAL LOW (ref 8.9–10.3)
Chloride: 110 mmol/L (ref 101–111)
Creatinine, Ser: 1 mg/dL (ref 0.61–1.24)
GFR calc non Af Amer: 59 mL/min — ABNORMAL LOW (ref >60)
GLUCOSE: 73 mg/dL (ref 65–99)
POTASSIUM: 3.9 mmol/L (ref 3.5–5.1)
SODIUM: 140 mmol/L (ref 135–145)
TOTAL PROTEIN: 6.8 g/dL (ref 6.5–8.1)
Total Bilirubin: 1.1 mg/dL (ref 0.3–1.2)

## 2017-02-07 LAB — CBC
HCT: 38.7 % — ABNORMAL LOW (ref 39.0–52.0)
HEMOGLOBIN: 13.4 g/dL (ref 13.0–17.0)
MCH: 34.7 pg — AB (ref 26.0–34.0)
MCHC: 34.6 g/dL (ref 30.0–36.0)
MCV: 100.3 fL — ABNORMAL HIGH (ref 78.0–100.0)
Platelets: 120 10*3/uL — ABNORMAL LOW (ref 150–400)
RBC: 3.86 MIL/uL — AB (ref 4.22–5.81)
RDW: 12.1 % (ref 11.5–15.5)
WBC: 4.7 10*3/uL (ref 4.0–10.5)

## 2017-02-07 LAB — HIV ANTIBODY (ROUTINE TESTING W REFLEX): HIV Screen 4th Generation wRfx: NONREACTIVE

## 2017-02-07 MED ORDER — METOPROLOL SUCCINATE ER 100 MG PO TB24
100.0000 mg | ORAL_TABLET | Freq: Every day | ORAL | Status: DC
  Administered 2017-02-08: 100 mg via ORAL
  Filled 2017-02-07: qty 1
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
## 2017-02-07 NOTE — Evaluation (Signed)
Clinical/Bedside Swallow Evaluation Patient Details  Name: William Nicholson MRN: [id number] Date of Birth: 1913/04/01  Today's Date: 02/07/2017 Time: SLP Start Time (ACUTE ONLY): 1320 SLP Stop Time (ACUTE ONLY): 1347 SLP Time Calculation (min) (ACUTE ONLY): 27 min  Past Medical History:  Past Medical History:  Diagnosis Date  . Hypertension    Past Surgical History: History reviewed. No pertinent surgical history. HPI:  83 year old male admitted 02/06/17 with confusion, agitation, and decreased po intake. PMH significant for HTN, AFib. head CT revealed severe atrophy, no acute abnormality. CXR revealed hyperinflation without focal infiltrate. BSE ordered to determine least restrictive diet.    Assessment / Plan / Recommendation Clinical Impression  Oral care was completed with suction. Pt was noted to have missing dentition, with remaining teeth/gums in poor condition. No family present to confirm swallowing status or diet prior to admit. Pt appeared to tolerate all consistencies given (water, puree, softened cracker) without oral residue or overt s/s aspiration. At this time, will recommend Dysphagia 2 diet (fine chop) with thin liquids via straw. Recommend meds be crushed if able, and provided one at a time in puree if not. Safe swallow precautions posted at [facility] - pt should be upright, and fully awake and alert prior to feeding. Oral care is recommended before AND after po intake. Small bites/sips at a slow rate, and minimized distractions. ST will follow for assessment of diet tolerance and education. RN aware of results and recommendations.   SLP Visit Diagnosis: Dysphagia, oropharyngeal phase (R13.12)    Aspiration Risk  Mild aspiration risk    Diet Recommendation Dysphagia 2 (Fine chop);Thin liquid   Liquid Administration via: Straw Medication Administration: Crushed with puree Supervision: Full supervision/cueing for compensatory strategies Compensations: Minimize environmental  distractions;Slow rate;Small sips/bites Postural Changes: Seated upright at 90 degrees;Remain upright for at least 30 minutes after po intake    Other  Recommendations Oral Care Recommendations: Oral care before and after PO Other Recommendations: Have oral suction available   Follow up Recommendations 24 hour supervision/assistance      Frequency and Duration min 2x/week  2 weeks       Prognosis Prognosis for Safe Diet Advancement: Guarded Barriers to Reach Goals: Cognitive deficits;Severity of deficits      Swallow Study   General Date of Onset: 02/06/17 HPI: 83 year old male admitted 02/06/17 with confusion, agitation, and decreased po intake. PMH significant for HTN, AFib. head CT revealed severe atrophy, no acute abnormality. CXR revealed hyperinflation without focal infiltrate. BSE ordered to determine least restrictive diet.  Type of Study: Bedside Swallow Evaluation Previous Swallow Assessment: none found Diet Prior to this Study: NPO Temperature Spikes Noted: No Respiratory Status: Room air History of Recent Intubation: No Behavior/Cognition: Alert;Cooperative;Doesn't follow directions;Requires cueing Oral Cavity Assessment: Within Functional Limits Oral Care Completed by SLP: Yes Oral Cavity - Dentition: Missing dentition;Poor condition Self-Feeding Abilities: Total assist Patient Positioning: Upright in bed Baseline Vocal Quality: Not observed Volitional Cough: Cognitively unable to elicit Volitional Swallow: Unable to elicit    Oral/Motor/Sensory Function Overall Oral Motor/Sensory Function:  (unable to assess, due to pt inability to follow verbal commands)   Ice Chips Ice chips: Within functional limits Presentation: Spoon   Thin Liquid Thin Liquid: Within functional limits Presentation: Straw    Nectar Thick Nectar Thick Liquid: Not tested   Honey Thick Honey Thick Liquid: Not tested   Puree Puree: Within functional limits Presentation: Spoon    Solid   GO   Solid: Within functional  limits (softened graham cracker)    Functional Assessment Tool Used: asha noms, clinical judgment, BSE Functional Limitations: Swallowing Swallow Current Status ([id number]): At least 40 percent but less than 60 percent impaired, limited or restricted Swallow Goal Status [phone]): At least 40 percent but less than 60 percent impaired, limited or restricted  Jerauld Bostwick B. Murvin Natal [facility], CCC-SLP [phone]  Leigh Aurora 02/07/2017,1:56 PM

## 2017-02-08 ENCOUNTER — Ambulatory Visit: Payer: Medicare HMO | Admitting: Podiatry

## 2017-02-08 DIAGNOSIS — R1310 Dysphagia, unspecified: Secondary | ICD-10-CM

## 2017-02-08 DIAGNOSIS — F03918 Unspecified dementia, unspecified severity, with other behavioral disturbance: Secondary | ICD-10-CM

## 2017-02-08 DIAGNOSIS — F0391 Unspecified dementia with behavioral disturbance: Secondary | ICD-10-CM

## 2017-02-08 LAB — BASIC METABOLIC PANEL
Anion gap: 4 — ABNORMAL LOW (ref 5–15)
BUN: 25 mg/dL — ABNORMAL HIGH (ref 6–20)
CALCIUM: 7.4 mg/dL — AB (ref 8.9–10.3)
CO2: 19 mmol/L — AB (ref 22–32)
Chloride: 115 mmol/L — ABNORMAL HIGH (ref 101–111)
Creatinine, Ser: 0.95 mg/dL (ref 0.61–1.24)
GFR calc Af Amer: >60 mL/min (ref >60)
GFR calc non Af Amer: >60 mL/min (ref >60)
GLUCOSE: 93 mg/dL (ref 65–99)
Potassium: 3.6 mmol/L (ref 3.5–5.1)
Sodium: 138 mmol/L (ref 135–145)

## 2017-02-08 LAB — CBC
HEMATOCRIT: 30.3 % — AB (ref 39.0–52.0)
Hemoglobin: 10.6 g/dL — ABNORMAL LOW (ref 13.0–17.0)
MCH: 33.9 pg (ref 26.0–34.0)
MCHC: 35 g/dL (ref 30.0–36.0)
MCV: 96.8 fL (ref 78.0–100.0)
Platelets: 101 10*3/uL — ABNORMAL LOW (ref 150–400)
RBC: 3.13 MIL/uL — ABNORMAL LOW (ref 4.22–5.81)
RDW: 12 % (ref 11.5–15.5)
WBC: 4.2 10*3/uL (ref 4.0–10.5)

## 2017-02-08 LAB — RPR, QUANT+TP ABS (REFLEX)
Rapid Plasma Reagin, Quant: 1:1 {titer} — ABNORMAL HIGH
T Pallidum Abs: POSITIVE — AB

## 2017-02-08 LAB — RPR: RPR Ser Ql: REACTIVE — AB

## 2017-02-08 NOTE — Progress Notes (Signed)
  Speech Language Pathology Treatment: Dysphagia  Patient Details Name: William Nicholson MRN: [id number] DOB: [birth year] Today's Date: 02/08/2017 Time: 5909-3112 SLP Time Calculation (min) (ACUTE ONLY): 12 min  Assessment / Plan / Recommendation Clinical Impression  Pt demonstrates stable ability to consume PO with no signs of aspiration or difficulty masticating, other than decreased labial closure while chewing. RN reports that pt has tolerated meals well and mentation is generally improving, though he was very confused in the pm. Given possibly of waxing and waning mental status or sundowning, will continue to offer soft foods, dys 2 texture, though pt could also foods of choice when alert. No SLP f/u needed at this time.   HPI HPI: 83 year old male admitted 02/06/17 with confusion, agitation, and decreased po intake. PMH significant for HTN, AFib. head CT revealed severe atrophy, no acute abnormality. CXR revealed hyperinflation without focal infiltrate. BSE ordered to determine least restrictive diet.       SLP Plan  All goals met       Recommendations  Diet recommendations: Dysphagia 2 (fine chop);Thin liquid Liquids provided via: Cup;Straw Medication Administration: Crushed with puree Supervision: Staff to assist with self feeding Compensations: Minimize environmental distractions;Slow rate;Small sips/bites                Oral Care Recommendations: Oral care before and after PO Follow up Recommendations: 24 hour supervision/assistance SLP Visit Diagnosis: Dysphagia, oropharyngeal phase (R13.12) Plan: All goals met       GO               William Baltimore, MA CCC-SLP [phone]  William Nicholson 02/08/2017, 11:23 AM

## 2017-02-08 NOTE — Progress Notes (Signed)
D/C instructions reviewed w/ pt's dtr at bedside.  All questions answered, dtr verbalizes understanding.  Pt d/c in stable condition in w/c by NT to family's car.  Dtr in possession of d/c instructions and and all personal belongings.

## 2017-02-08 NOTE — Progress Notes (Signed)
Spoke with pt's family concerning HH. [facility] coming into the [facility]'s home. [facility] will need [facility] with [facility].
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
## 2017-02-16 DIAGNOSIS — R5383 Other fatigue: Secondary | ICD-10-CM | POA: Diagnosis not present

## 2017-02-23 DIAGNOSIS — F0391 Unspecified dementia with behavioral disturbance: Secondary | ICD-10-CM | POA: Diagnosis not present

## 2017-02-23 DIAGNOSIS — I482 Chronic atrial fibrillation: Secondary | ICD-10-CM | POA: Diagnosis not present

## 2017-02-23 DIAGNOSIS — D696 Thrombocytopenia, unspecified: Secondary | ICD-10-CM | POA: Diagnosis not present

## 2017-02-23 DIAGNOSIS — F5101 Primary insomnia: Secondary | ICD-10-CM | POA: Diagnosis not present

## 2017-03-01 DIAGNOSIS — A539 Syphilis, unspecified: Secondary | ICD-10-CM | POA: Diagnosis not present

## 2017-03-04 DIAGNOSIS — R531 Weakness: Secondary | ICD-10-CM | POA: Diagnosis not present

## 2017-03-10 DIAGNOSIS — R531 Weakness: Secondary | ICD-10-CM | POA: Diagnosis not present

## 2017-03-11 DIAGNOSIS — N390 Urinary tract infection, site not specified: Secondary | ICD-10-CM | POA: Diagnosis not present

## 2017-04-14 ENCOUNTER — Ambulatory Visit (INDEPENDENT_AMBULATORY_CARE_PROVIDER_SITE_OTHER): Payer: Medicare HMO | Admitting: Neurology

## 2017-04-14 ENCOUNTER — Encounter: Payer: Self-pay | Admitting: Neurology

## 2017-04-14 VITALS — BP 114/81 | HR 94

## 2017-04-14 DIAGNOSIS — F0390 Unspecified dementia without behavioral disturbance: Secondary | ICD-10-CM

## 2017-04-14 NOTE — Progress Notes (Signed)
[facility]    Provider:  Dr Lucia Gaskins Referring Provider: Georgianne Fick, MD Primary Care Physician:  Georgianne Fick, MD  CC:  Memory loss  HPI:  BREVYN RING is a 83 y.o. male here as a referral from Dr. Nicholos Johns for memory loss. Past medical history of high blood pressure, A. fib who was brought to the hospital on May 22 for a 3 day history of confusion. Her a review of hospital records, He was noted by his family to be confused agitated and decreased oral intake. Daughter is the main historian as patient is unable to provide information. He has 2 daughters that alternate caring for him. Patient was agitated and was given a prescription for Ativan and use to the previous night. He was agitated in the emergency room. CT revealed significant atrophy but no acute abnormality. Acute encephalopathy most likely due to progression of dementia, normal UA and no abnormalities on chest x-ray, also negative CT head 2. He was asked to avoid benzodiazepines. Continue supportive care. His A. fib is rate controlled on metoprolol and Xarelto. Hypertension controlled with metoprolol. He was however dehydrated with acute kidney injury. QTC prolongation was noted. He is hard of hearing. Her physician notes, he was diagnosed with syphilis in 2000 and given a course of doxycycline as well as his partner. This RPR remains positive and they initiated intramuscular penicillin.  He is here with his daughter who provides most information. Daughter says he was encephalopathic in the hospital and diagnosed with dementia. The memory changes have been ongoing for several years. He was started on ativan which caused an encephalopathic episode in the hospital with dehydrated. He was given the ativan for agitation. Everything is back to previous baseline since leaving the hospital. Discussed dementia, it is difficult with this patient at 89 with significant blindness and hearing loss.   Reviewed  notes, labs and imaging from outside physicians, which showed:  Patient recently admitted to [facility] for acute encephalopathy. Personally reviewed CT of the head that showed severe generalized atrophy and chronic microvascular disease without acute event. Reviewed labs RPR was positive with 1:1 patient has been treated in the past. B12 385. Ammonia normal. HIV negative.   Review of Systems: Patient complains of symptoms per HPI as well as the following symptoms:agitation. Pertinent negatives and positives per HPI. All others negative.   Social History   Social History  . Marital status: Widowed    Spouse name: N/A  . Number of children: 3  . Years of education: N/A   Occupational History  . Not on file.   Social History Main Topics  . Smoking status: Never Smoker  . Smokeless tobacco: Never Used  . Alcohol use No  . Drug use: Unknown  . Sexual activity: Not on file   Other Topics Concern  . Not on file   Social History Narrative   Lives at home, children care for him   Caffeine: none    Family History  Problem Relation Age of Onset  . Dementia Neg Hx     Past Medical History:  Diagnosis Date  . Atrial fibrillation (HCC)   . Hypertension     Past Surgical History:  Procedure Laterality Date  . PROSTATE SURGERY  2012    Current Outpatient Prescriptions  Medication Sig Dispense Refill  . finasteride (PROSCAR) 5 MG tablet Take 5 mg by mouth every morning.   11  . metoprolol succinate (TOPROL-XL) 100 MG 24 hr tablet Take 100  mg by mouth every morning.   6  . Rivaroxaban (XARELTO) 15 MG TABS tablet Take 15 mg by mouth every evening.      No current facility-administered medications for this visit.     Allergies as of 04/14/2017  . (No Known Allergies)    Vitals: BP 114/81   Pulse 94  Last Weight:  Wt Readings from Last 1 Encounters:  02/08/17 102 lb 15.3 oz (46.7 kg)   Last Height:   Ht Readings from Last 1 Encounters:  02/06/17 6\' 2"   (1.88 m)    Physical exam: Exam: Gen: NAD, not conversant, frail and thin          CV: irregular, no MRG. No Carotid Bruits. No peripheral edema, warm, nontender Eyes: Conjunctivae clear without exudates or hemorrhage  Neuro: Detailed Neurologic Exam  Speech:    Speech is dysarthric, not spontaneous with imapired comprehension.  Cognition:  MMSE - Mini Mental State Exam 04/14/2017  Orientation to time 1  Orientation to Place 2  Registration 2  Attention/ Calculation 1  Recall 0  Language- name 2 objects 2  Language- repeat 0  Language- follow 3 step command 3  Language- read & follow direction 0  Write a sentence 0  Copy design 0  Total score 11      The patient is oriented to person only    recent and remote memory impaired;     language dysarthric;     Impaired attention, concentration,  fund of knowledge Cranial Nerves:    The pupils are equal, round, and minimallyto light. Could not visualize fundi. Visual fields are significantly impaired with loss of most vision cannot count fingers in his periphery or central vision. Conjugate midline gaze. Trigeminal sensation is intact and the muscles of mastication are normal. The face is symmetric. The palate elevates in the midline. Hearing impaired. Voice is hoarse. Shoulder shrug is normal. The tongue has normal motion without fasciculations.   Coordination:    No dysmetria  Gait:    Attempted patient cannot stand unattended  Motor Observation:    No asymmetry, no atrophy, and no involuntary movements noted. Tone:    Normal muscle tone.    Posture:    stooped    Strength:    Strength is antigravity and equal     Sensation: intact to LT     Reflex Exam:  DTR's:    Absent AJs Toes:    The toes are equivocal bilaterally.   Clonus:    Clonus is absent.      Assessment/Plan:  83 year old patient with dementia Mini-Mental status exam 11 out of 30. Patient at this time is happy at home and pleasant. Discussed  starting medications however I think the benefit at this point for Namenda or Aricept would be minimal. Discussed side effects. Family declines medication at this time and prefers to follow clinically. Discussed dementia as including Alzheimer's or vascular dementia and other different kinds of dementia, evaluation, treatment. We'll follow clinically. No agitation or behavioral problems at home at this time.   Naomie DeanAntonia Daphna Lafuente, MD  [facility] [address] [place], [place] [id number]  Phone [phone] Fax [phone]

## 2017-04-14 NOTE — Patient Instructions (Signed)
Remember to drink plenty of fluid, eat healthy meals and do not skip any meals. Try to eat protein with a every meal and eat a healthy snack such as fruit or nuts in between meals. Try to keep a regular sleep-wake schedule and try to exercise daily, particularly in the form of walking, 20-30 minutes a day, if you can.   I would like to see you back in 6 months, sooner if we need to. Please call us with any interim questions, concerns, problems, updates or refill requests.   Our phone number is [phone]. We also have an after hours call service for urgent matters and there is a physician on-call for urgent questions. For any emergencies you know to call 911 or go to the nearest emergency room   Alzheimer Disease Alzheimer disease is a brain disease that affects memory, thinking, and behavior. People with Alzheimer disease lose mental abilities, and the disease gets worse over time. Survival with Alzheimer disease ranges from several years to as long as 20 years. What are the causes? This condition develops when a protein called beta-amyloid forms deposits in the brain. It is not known what causes these deposits to form. What increases the risk? This condition is more likely to develop in people who:  Are elderly.  Have a family history of dementia.  Have had a brain injury.  Have heart or blood vessel disease.  Have had a stroke.  Have high blood pressure or high cholesterol.  Have diabetes.  What are the signs or symptoms? Symptoms of this condition happen in three stages, which often overlap. Early stage In this stage, you may continue to be independent. You may still be able to drive, work, and be social. Symptoms in this stage include:  Minor memory problems, such as forgetting a name or what you read.  Difficulty with: ? Paying attention. ? Communicating. ? Doing familiar tasks. ? Learning new things.  Needing more time to do daily activities.  Anxiety.  Social  withdrawal.  Loss of motivation.  Moderate stage In this stage, you will start to need care. This stage usually lasts the longest. Symptoms in this stage include:  Difficulty with expressing thoughts.  Memory loss that affects daily life. This can include forgetting: ? Your address or phone number. ? Events that have happened. ? Parts of your personal history, like where you went to school.  Confusion about where you are or what time it is.  Difficulty in judging distance.  Changes in personality, mood, and behavior. You may be moody, irritable, angry, frustrated, fearful, anxious, or suspicious.  Poor reasoning and judgment.  Delusions or hallucinations.  Changes in sleep patterns.  Wandering and getting lost.  Severe stage In the final stage, you will need help with your personal care and dailyactivities. Symptoms in this stage include:  Worsening memory loss.  Personality changes.  Loss of awareness of your surroundings.  Changes in physical abilities, including the ability to walk, sit, and swallow.  Difficulty in communicating.  Inability to control the bladder and bowels.  Increasing confusion.  Increasing disruptive behavior.  How is this diagnosed? This condition is diagnosed with an assessment by your health care provider. During this assessment, your health care provider will talk with you and your family, friends, or caregivers about your symptoms. A thorough medical history will be taken, and you will have a physical exam and tests. Tests may include:  Lab tests, such as blood or urine tests.  Imaging tests,  such as a CT scan, PET scan, or MRI.  A lumbar puncture. This test involves removing and testing a small amount of the fluid that surrounds the brain and spinal cord.  An electroencephalogram (EEG). In this test, small metal discs are used to measure electrical activity in the brain.  Memory tests, cognitive tests, and neuropsychological  tests. These tests evaluate brain function.  How is this treated? At this time, there is no treatment to cure Alzheimer disease or stop it from getting worse. The goals of treatment are:  To slow down the disease.  To manage behavioral problems.  To provide you with a safe environment.  To make life easier for you and your caregivers.  The following treatment options are available:  Medicines. Medicines may help to slow down memory loss and control behavioral symptoms.  Talk therapy. Talk therapy provides you with education, support, and memory aids. It is most helpful in the early stages of the condition.  Counseling or spiritual guidance. It is normal to have a lot of feelings, including anger, relief, fear, and isolation. Counseling and guidance can help you deal with these feelings.  Caregiving. This involves having caregivers help you with your daily activities. Caregivers may be family members, friends, or trained medical professionals. Caregiving can be done at home or outside the home.  Family support groups. These provide education, emotional support, and information about community resources to family members who are taking care of you.  Follow these instructions at home: Medicines  Take over-the-counter and prescription medicines only as told by your health care provider.  Avoid taking medicines that can affect thinking, such as pain or sleeping medicines. Lifestyle   Make healthy lifestyle choices: ? Be physically active as told by your health care provider. ? Do not use any tobacco products, such as cigarettes, chewing tobacco, and e-cigarettes. If you need help quitting, ask your health care provider. ? Eat a healthy diet. ? Practice stress-management techniques when you get stressed. ? Stay social.  Drink enough fluid to keep your urine clear or pale yellow.  Make sure to get quality sleep. These tips can help you get a good night's rest: ? Avoid napping  during the day. ? Keep your sleeping area dark and cool. ? Avoid exercising during the few hours before you go to bed. ? Avoid caffeine products in the evening. General instructions  Work with your health care provider to determine what you need help with and what your safety needs are.  If you were given a bracelet that tracks your location, make sure to wear it.  Keep all follow-up visits as told by your health care provider. This is important.  If you have questions or would like additional support, you may contact The Alzheimer's Association: ? 24-hour helpline: (503)427-59321-603-036-2190 ? Website: LimitLaws.huwww.alz.org Contact a health care provider if:  You have nausea, vomiting, or trouble with eating.  You have dizziness, or weakness.  You have new or worsening trouble with sleeping.  You or your family members become concerned for your safety. Get help right away if:  You develop chest pain or difficulty with breathing.  You pass out. This information is not intended to replace advice given to you by your health care provider. Make sure you discuss any questions you have with your health care provider. Document Released: 06/15/2004 Document Revised: 06/04/2016 Document Reviewed: 07/02/2015 Elsevier Interactive Patient Education  2017 [employer]

## 2017-04-15 DIAGNOSIS — F411 Generalized anxiety disorder: Secondary | ICD-10-CM | POA: Diagnosis not present

## 2017-04-15 DIAGNOSIS — F0391 Unspecified dementia with behavioral disturbance: Secondary | ICD-10-CM | POA: Diagnosis not present

## 2017-04-15 DIAGNOSIS — I482 Chronic atrial fibrillation: Secondary | ICD-10-CM | POA: Diagnosis not present

## 2017-04-15 DIAGNOSIS — D696 Thrombocytopenia, unspecified: Secondary | ICD-10-CM | POA: Diagnosis not present

## 2017-04-15 DIAGNOSIS — E538 Deficiency of other specified B group vitamins: Secondary | ICD-10-CM | POA: Diagnosis not present

## 2017-04-21 ENCOUNTER — Encounter: Payer: Self-pay | Admitting: Neurology

## 2017-05-20 ENCOUNTER — Encounter: Payer: Self-pay | Admitting: Podiatry

## 2017-05-20 ENCOUNTER — Ambulatory Visit (INDEPENDENT_AMBULATORY_CARE_PROVIDER_SITE_OTHER): Payer: Medicare HMO | Admitting: Podiatry

## 2017-05-20 DIAGNOSIS — B351 Tinea unguium: Secondary | ICD-10-CM | POA: Diagnosis not present

## 2017-05-20 DIAGNOSIS — I739 Peripheral vascular disease, unspecified: Secondary | ICD-10-CM

## 2017-05-20 DIAGNOSIS — M79676 Pain in unspecified toe(s): Secondary | ICD-10-CM

## 2017-05-20 NOTE — Progress Notes (Signed)
Patient ID: William Nicholson, male   DOB: [birth year], [age] y.o.   MRN: [id number] HPI  Complaint:  Visit Type: Patient returns to my office for continued preventative foot care services. Complaint: Patient states" my nails have grown long and thick and become painful to walk and wear shoes"  . This patient  presents for preventative foot care services. No changes to ROS.  Patients daughter wants her fathers nails only to be debrided.  Podiatric Exam: Vascular: dorsalis pedis and posterior tibial pulses are non-palpable. Capillary return is diminished. Cold feet noted.. Skin turgor WNL, bilateral swelling  Sensorium: Normal Semmes Weinstein monofilament test. Normal tactile sensation bilaterally.  Nail Exam: Pt has thick disfigured discolored nails with subungual debris noted bilateral entire nail hallux through fifth toenails Ulcer Exam: There is no evidence of ulcer or pre-ulcerative changes or infection. Orthopedic Exam: Muscle tone and strength are WNL. No limitations in general ROM. No crepitus or effusions noted. Foot type and digits show no abnormalities. Bony prominences are unremarkable. Skin: No Porokeratosis. No infection or ulcers.   Diagnosis:  Onychomycosis, Pain in right toe, pain in left toes  Treatment & Plan Procedures and Treatment: Consent by patient was obtained for treatment procedures. The patient understood the discussion of treatment and procedures well. All questions were answered thoroughly reviewed. Debridement of mycotic and hypertrophic toenails, 1 through 5 bilateral and clearing of subungual debris. No ulceration, no infection noted.  Used dremel tool only. Return Visit-Office Procedure: Patient instructed to return to the office for a follow up visit 3 months for continued evaluation and treatment.    Helane GuntherGregory Sinthia Karabin DPM

## 2017-06-22 DIAGNOSIS — N390 Urinary tract infection, site not specified: Secondary | ICD-10-CM | POA: Diagnosis not present

## 2017-06-23 ENCOUNTER — Emergency Department (HOSPITAL_COMMUNITY): Payer: Medicare HMO

## 2017-06-23 ENCOUNTER — Encounter (HOSPITAL_COMMUNITY): Payer: Self-pay | Admitting: Emergency Medicine

## 2017-06-23 ENCOUNTER — Emergency Department (HOSPITAL_COMMUNITY)
Admission: EM | Admit: 2017-06-23 | Discharge: 2017-06-23 | Disposition: A | Discharge status: Home / Self Care | Payer: Medicare HMO | Attending: Emergency Medicine | Admitting: Emergency Medicine

## 2017-06-23 DIAGNOSIS — E860 Dehydration: Secondary | ICD-10-CM | POA: Insufficient documentation

## 2017-06-23 DIAGNOSIS — I10 Essential (primary) hypertension: Secondary | ICD-10-CM | POA: Insufficient documentation

## 2017-06-23 DIAGNOSIS — R531 Weakness: Secondary | ICD-10-CM | POA: Diagnosis not present

## 2017-06-23 DIAGNOSIS — N390 Urinary tract infection, site not specified: Secondary | ICD-10-CM

## 2017-06-23 DIAGNOSIS — F0391 Unspecified dementia with behavioral disturbance: Secondary | ICD-10-CM | POA: Insufficient documentation

## 2017-06-23 DIAGNOSIS — R9431 Abnormal electrocardiogram [ECG] [EKG]: Secondary | ICD-10-CM | POA: Diagnosis not present

## 2017-06-23 DIAGNOSIS — R630 Anorexia: Secondary | ICD-10-CM | POA: Diagnosis present

## 2017-06-23 DIAGNOSIS — Z79899 Other long term (current) drug therapy: Secondary | ICD-10-CM | POA: Diagnosis not present

## 2017-06-23 DIAGNOSIS — I517 Cardiomegaly: Secondary | ICD-10-CM | POA: Diagnosis not present

## 2017-06-23 LAB — URINALYSIS, ROUTINE W REFLEX MICROSCOPIC
Bilirubin Urine: NEGATIVE
Glucose, UA: NEGATIVE mg/dL
Ketones, ur: NEGATIVE mg/dL
Nitrite: NEGATIVE
Protein, ur: 30 mg/dL — AB
Specific Gravity, Urine: 1.015 (ref 1.005–1.030)
Squamous Epithelial / HPF: NONE SEEN
pH: 5 (ref 5.0–8.0)

## 2017-06-23 LAB — BASIC METABOLIC PANEL WITH GFR
Anion gap: 4 — ABNORMAL LOW (ref 5–15)
BUN: 43 mg/dL — ABNORMAL HIGH (ref 6–20)
CO2: 24 mmol/L (ref 22–32)
Calcium: 8.9 mg/dL (ref 8.9–10.3)
Chloride: 112 mmol/L — ABNORMAL HIGH (ref 101–111)
Creatinine, Ser: 1.86 mg/dL — ABNORMAL HIGH (ref 0.61–1.24)
GFR calc Af Amer: 32 mL/min — ABNORMAL LOW (ref >60)
GFR calc non Af Amer: 28 mL/min — ABNORMAL LOW (ref >60)
Glucose, Bld: 109 mg/dL — ABNORMAL HIGH (ref 65–99)
Potassium: 4.1 mmol/L (ref 3.5–5.1)
Sodium: 140 mmol/L (ref 135–145)

## 2017-06-23 LAB — CBC
HEMATOCRIT: 36.7 % — AB (ref 39.0–52.0)
HEMOGLOBIN: 12.7 g/dL — AB (ref 13.0–17.0)
MCH: 34.8 pg — ABNORMAL HIGH (ref 26.0–34.0)
MCHC: 34.6 g/dL (ref 30.0–36.0)
MCV: 100.5 fL — ABNORMAL HIGH (ref 78.0–100.0)
Platelets: 137 10*3/uL — ABNORMAL LOW (ref 150–400)
RBC: 3.65 MIL/uL — ABNORMAL LOW (ref 4.22–5.81)
RDW: 12.8 % (ref 11.5–15.5)
WBC: 5.5 10*3/uL (ref 4.0–10.5)

## 2017-06-23 MED ORDER — SODIUM CHLORIDE 0.9 % IV BOLUS (SEPSIS)
1000.0000 mL | Freq: Once | INTRAVENOUS | Status: AC
  Administered 2017-06-23: 1000 mL via INTRAVENOUS

## 2017-06-23 MED ORDER — DEXTROSE 5 % IV SOLN
1.0000 g | Freq: Once | INTRAVENOUS | Status: AC
  Administered 2017-06-23: 1 g via INTRAVENOUS
  Filled 2017-06-23: qty 10

## 2017-06-23 MED ORDER — CEFADROXIL 500 MG/5ML PO SUSR
500.0000 mg | Freq: Two times a day (BID) | ORAL | 0 refills | Status: AC
Start: 2017-06-23 — End: 2017-06-30

## 2017-06-23 NOTE — ED Notes (Signed)
Per family pt has not been eating or drinking as much and has been sleeping more.  Pt does not report any pain.

## 2017-06-23 NOTE — ED Provider Notes (Signed)
[facility]-EMERGENCY DEPT Provider Note   CSN: [id number] Arrival date & time: 06/23/17  1647     History   Chief Complaint Chief Complaint  Patient presents with  . decreased appetite  . Fatigue    HPI William Nicholson is a 83 y.o. male.  HPI Patient presents to the emergency room for increasing fatigue and poor appetite over the past week. Family noticed the patient has not been eating or drinking as well. He has been sleeping a lot more than usual. He has not been complaining of any pain. He has not been having any fevers. Patient does have a history of dementia and resides at home with his family members. Earlier in the week and wanted to try to bring him to the doctor's office but they could not physically get him in. Past Medical History:  Diagnosis Date  . Atrial fibrillation (HCC)   . Hypertension     Patient Active Problem List   Diagnosis Date Noted  . Dysphagia   . Dementia with behavioral disturbance   . Encephalopathy 02/07/2017  . Acute encephalopathy 02/06/2017  . Hypertension 02/06/2017  . BPH (benign prostatic hyperplasia) 02/06/2017  . Atrial fibrillation (HCC) 02/06/2017  . Thrombocytopenia (HCC) 02/06/2017  . AKI (acute kidney injury) (HCC) 02/06/2017    Past Surgical History:  Procedure Laterality Date  . PROSTATE SURGERY  2012       Home Medications    Prior to Admission medications   Medication Sig Start Date End Date Taking? Authorizing Provider  ELIQUIS 2.5 MG TABS tablet Take 2.5 mg by mouth 2 (two) times daily.  06/22/17  Yes [provider]  finasteride (PROSCAR) 5 MG tablet Take 5 mg by mouth every morning.  02/16/16  Yes [provider]  metoprolol succinate (TOPROL-XL) 100 MG 24 hr tablet Take 100 mg by mouth every morning.  11/09/14  Yes [provider]  cefadroxil (DURICEF) 500 MG/5ML suspension Take 5 mLs (500 mg total) by mouth 2 (two) times daily. 06/23/17 06/30/17  Linwood Dibbles, MD    Family History Family  History  Problem Relation Age of Onset  . Dementia Neg Hx     Social History Social History  Substance Use Topics  . Smoking status: Never Smoker  . Smokeless tobacco: Never Used  . Alcohol use No     Allergies   Patient has no known allergies.   Review of Systems Review of Systems  All other systems reviewed and are negative.    Physical Exam Updated Vital Signs BP (!) 141/115 (BP Location: Left Arm)   Pulse (!) 106   Temp (!) 97.5 F (36.4 C) (Axillary)   Resp 13   SpO2 100%   Physical Exam  Constitutional: No distress.  Elderly, frail, thin  HENT:  Head: Normocephalic and atraumatic.  Right Ear: External ear normal.  Left Ear: External ear normal.  Mucous membranes are dry  Eyes: Conjunctivae are normal. Right eye exhibits no discharge. Left eye exhibits no discharge. No scleral icterus.  Neck: Neck supple. No tracheal deviation present.  Cardiovascular: Normal rate, regular rhythm and intact distal pulses.   Pulmonary/Chest: Effort normal and breath sounds normal. No stridor. No respiratory distress. He has no wheezes. He has no rales.  Abdominal: Soft. Bowel sounds are normal. He exhibits no distension. There is no tenderness. There is no rebound and no guarding.  Musculoskeletal: He exhibits no edema or tenderness.  Neurological: He is alert. No cranial nerve deficit (no facial droop, extraocular  movements intact, no slurred speech) or sensory deficit. He exhibits normal muscle tone. He displays no seizure activity. GCS eye subscore is 4. GCS verbal subscore is 4. GCS motor subscore is 6.  Generalized weakness although patient will move all extremities, he does not lift his legs or arms off the bed  Skin: Skin is warm and dry. No rash noted. He is not diaphoretic.  Psychiatric: He has a normal mood and affect.  Nursing note and vitals reviewed.    ED Treatments / Results  Labs (all labs ordered are listed, but only abnormal results are displayed) Labs  Reviewed  BASIC METABOLIC PANEL - Abnormal; Notable for the following:       Result Value   Chloride 112 (*)    Glucose, Bld 109 (*)    BUN 43 (*)    Creatinine, Ser 1.86 (*)    GFR calc non Af Amer 28 (*)    GFR calc Af Amer 32 (*)    Anion gap 4 (*)    All other components within normal limits  CBC - Abnormal; Notable for the following:    RBC 3.65 (*)    Hemoglobin 12.7 (*)    HCT 36.7 (*)    MCV 100.5 (*)    MCH 34.8 (*)    Platelets 137 (*)    All other components within normal limits  URINALYSIS, ROUTINE W REFLEX MICROSCOPIC - Abnormal; Notable for the following:    APPearance HAZY (*)    Hgb urine dipstick SMALL (*)    Protein, ur 30 (*)    Leukocytes, UA TRACE (*)    Bacteria, UA MANY (*)    All other components within normal limits  CBG MONITORING, ED    EKG  EKG Interpretation  Date/Time:  Thursday June 23 2017 18:23:12 EDT Ventricular Rate:  103 PR Interval:    QRS Duration: 82 QT Interval:  332 QTC Calculation: 435 R Axis:   -66 Text Interpretation:  Atrial fibrillation Left anterior fascicular block Anterior infarct, old Borderline repolarization abnormality No significant change since last tracing Confirmed by Linwood Dibbles [phone]) on 06/23/2017 10:10:26 PM       Radiology Dg Chest 2 View  Result Date: 06/23/2017 CLINICAL DATA:  83 year old with weakness. EXAM: CHEST  2 VIEW COMPARISON:  Radiographs 02/06/2017 FINDINGS: Chronic hyperinflation and interstitial coarsening. Decreased cardiomegaly from prior exam. Mediastinal contours are unchanged. No pulmonary edema. Possible tiny pleural effusions. No confluent airspace disease. No pneumothorax. No acute osseous abnormality. IMPRESSION: 1. Chronic hyperinflation and interstitial coarsening. 2. Possible small pleural effusions. 3. Decreased cardiomegaly from prior. Electronically Signed   By: Rubye Oaks M.D.   On: 06/23/2017 19:27    Procedures Procedures (including critical care  time)  Medications Ordered in ED Medications  cefTRIAXone (ROCEPHIN) 1 g in dextrose 5 % 50 mL IVPB (not administered)  sodium chloride 0.9 % bolus 1,000 mL (0 mLs Intravenous Stopped 06/23/17 2110)     Initial Impression / Assessment and Plan / ED Course  I have reviewed the triage vital signs and the nursing notes.  Pertinent labs & imaging results that were available during my care of the patient were reviewed by me and considered in my medical decision making (see chart for details).  Clinical Course as of Jun 23 2237  Thu Jun 23, 2017  2217 Pt was able to drink some fluids while he was here  [JK]    Clinical Course User Index [JK] Linwood Dibbles, MD  Patient presented to the emergency room for evaluation of decreased appetite and fatigue. His laboratory tests are notable for mild increase in his BUN and creatinine. This is consistent with dehydration. Patient was given a liter of IV fluids and he has been able to drink here in the emergency room.  Urinalysis does suggest urinary tract infection. Patient was given a dose of antibiotics and I will discharge home on oral medications.  I discussed the options of keeping the patient overnight in the hospital for fluids and observation. Family would prefer to bring him home. I do think this is reasonable to try to keep him at home considering his age.  They understand if he gets any worse and stops eating or drinking he should return.  He has an appointment to see his doctor tomorrow.  Final Clinical Impressions(s) / ED Diagnoses   Final diagnoses:  Dehydration  Lower urinary tract infectious disease    New Prescriptions New Prescriptions   CEFADROXIL (DURICEF) 500 MG/5ML SUSPENSION    Take 5 mLs (500 mg total) by mouth 2 (two) times daily.     Linwood DibblesKnapp, Icess Bertoni, MD 06/23/17 2240

## 2017-06-23 NOTE — Discharge Instructions (Signed)
Take the antibiotics as prescribed. Drink plenty of fluids. Follow-up with his doctor tomorrow as planned. Return to the emergency room for worsening symptoms , fever, vomiting

## 2017-06-23 NOTE — ED Triage Notes (Signed)
Family reports pt has been sleeping a lot and not eating or drinking for the past week. Pt sleeping in triage. Has denied any discomfort at home.

## 2017-07-13 ENCOUNTER — Encounter (HOSPITAL_COMMUNITY): Payer: Self-pay | Admitting: *Deleted

## 2017-07-13 ENCOUNTER — Observation Stay (HOSPITAL_COMMUNITY)
Admission: EM | Admit: 2017-07-13 | Discharge: 2017-07-14 | Disposition: A | Discharge status: Home / Self Care | Payer: Medicare HMO | Attending: Internal Medicine | Admitting: Internal Medicine

## 2017-07-13 DIAGNOSIS — I482 Chronic atrial fibrillation: Secondary | ICD-10-CM | POA: Diagnosis not present

## 2017-07-13 DIAGNOSIS — Z79899 Other long term (current) drug therapy: Secondary | ICD-10-CM | POA: Diagnosis not present

## 2017-07-13 DIAGNOSIS — F0391 Unspecified dementia with behavioral disturbance: Secondary | ICD-10-CM | POA: Insufficient documentation

## 2017-07-13 DIAGNOSIS — K921 Melena: Secondary | ICD-10-CM | POA: Diagnosis present

## 2017-07-13 DIAGNOSIS — I4891 Unspecified atrial fibrillation: Secondary | ICD-10-CM | POA: Diagnosis present

## 2017-07-13 DIAGNOSIS — K2901 Acute gastritis with bleeding: Secondary | ICD-10-CM

## 2017-07-13 DIAGNOSIS — N400 Enlarged prostate without lower urinary tract symptoms: Secondary | ICD-10-CM | POA: Diagnosis not present

## 2017-07-13 DIAGNOSIS — F03918 Unspecified dementia, unspecified severity, with other behavioral disturbance: Secondary | ICD-10-CM | POA: Diagnosis present

## 2017-07-13 DIAGNOSIS — N390 Urinary tract infection, site not specified: Secondary | ICD-10-CM | POA: Diagnosis not present

## 2017-07-13 DIAGNOSIS — D696 Thrombocytopenia, unspecified: Secondary | ICD-10-CM | POA: Diagnosis not present

## 2017-07-13 DIAGNOSIS — I10 Essential (primary) hypertension: Secondary | ICD-10-CM | POA: Insufficient documentation

## 2017-07-13 DIAGNOSIS — Z66 Do not resuscitate: Secondary | ICD-10-CM | POA: Insufficient documentation

## 2017-07-13 DIAGNOSIS — K922 Gastrointestinal hemorrhage, unspecified: Secondary | ICD-10-CM | POA: Diagnosis not present

## 2017-07-13 DIAGNOSIS — Z7901 Long term (current) use of anticoagulants: Secondary | ICD-10-CM | POA: Diagnosis not present

## 2017-07-13 DIAGNOSIS — Z7902 Long term (current) use of antithrombotics/antiplatelets: Secondary | ICD-10-CM | POA: Diagnosis not present

## 2017-07-13 DIAGNOSIS — R197 Diarrhea, unspecified: Secondary | ICD-10-CM | POA: Diagnosis not present

## 2017-07-13 DIAGNOSIS — R509 Fever, unspecified: Secondary | ICD-10-CM | POA: Diagnosis not present

## 2017-07-13 LAB — CBC WITH DIFFERENTIAL/PLATELET
BASOS ABS: 0 10*3/uL (ref 0.0–0.1)
BASOS PCT: 0 %
EOS ABS: 0.1 10*3/uL (ref 0.0–0.7)
EOS PCT: 2 %
HEMATOCRIT: 32.5 % — AB (ref 39.0–52.0)
Hemoglobin: 11.5 g/dL — ABNORMAL LOW (ref 13.0–17.0)
Lymphocytes Relative: 30 %
Lymphs Abs: 1.2 10*3/uL (ref 0.7–4.0)
MCH: 33.8 pg (ref 26.0–34.0)
MCHC: 35.4 g/dL (ref 30.0–36.0)
MCV: 95.6 fL (ref 78.0–100.0)
MONO ABS: 0.4 10*3/uL (ref 0.1–1.0)
MONOS PCT: 9 %
NEUTROS ABS: 2.4 10*3/uL (ref 1.7–7.7)
Neutrophils Relative %: 59 %
PLATELETS: 109 10*3/uL — AB (ref 150–400)
RBC: 3.4 MIL/uL — ABNORMAL LOW (ref 4.22–5.81)
RDW: 12.3 % (ref 11.5–15.5)
WBC: 4.1 10*3/uL (ref 4.0–10.5)

## 2017-07-13 LAB — COMPREHENSIVE METABOLIC PANEL
ALBUMIN: 3.2 g/dL — AB (ref 3.5–5.0)
ALT: 12 U/L — ABNORMAL LOW (ref 17–63)
ANION GAP: 5 (ref 5–15)
AST: 20 U/L (ref 15–41)
Alkaline Phosphatase: 79 U/L (ref 38–126)
BILIRUBIN TOTAL: 0.9 mg/dL (ref 0.3–1.2)
BUN: 13 mg/dL (ref 6–20)
CHLORIDE: 104 mmol/L (ref 101–111)
CO2: 25 mmol/L (ref 22–32)
Calcium: 9 mg/dL (ref 8.9–10.3)
Creatinine, Ser: 1.2 mg/dL (ref 0.61–1.24)
GFR calc Af Amer: 55 mL/min — ABNORMAL LOW (ref >60)
GFR, EST NON AFRICAN AMERICAN: 47 mL/min — AB (ref >60)
GLUCOSE: 92 mg/dL (ref 65–99)
POTASSIUM: 4.1 mmol/L (ref 3.5–5.1)
Sodium: 134 mmol/L — ABNORMAL LOW (ref 135–145)
TOTAL PROTEIN: 6.5 g/dL (ref 6.5–8.1)

## 2017-07-13 LAB — CBC
HCT: 33.8 % — ABNORMAL LOW (ref 39.0–52.0)
HEMOGLOBIN: 12 g/dL — AB (ref 13.0–17.0)
MCH: 34.3 pg — AB (ref 26.0–34.0)
MCHC: 35.5 g/dL (ref 30.0–36.0)
MCV: 96.6 fL (ref 78.0–100.0)
Platelets: 111 10*3/uL — ABNORMAL LOW (ref 150–400)
RBC: 3.5 MIL/uL — AB (ref 4.22–5.81)
RDW: 12.3 % (ref 11.5–15.5)
WBC: 4.2 10*3/uL (ref 4.0–10.5)

## 2017-07-13 LAB — TYPE AND SCREEN
ABO/RH(D): O POS
ANTIBODY SCREEN: NEGATIVE

## 2017-07-13 LAB — I-STAT CG4 LACTIC ACID, ED: Lactic Acid, Venous: 1.11 mmol/L (ref 0.5–1.9)

## 2017-07-13 LAB — PROTIME-INR
INR: 1.24
PROTHROMBIN TIME: 15.5 s — AB (ref 11.4–15.2)

## 2017-07-13 LAB — POC OCCULT BLOOD, ED: FECAL OCCULT BLD: POSITIVE — AB

## 2017-07-13 MED ORDER — ACETAMINOPHEN 650 MG RE SUPP: 650.0000 mg | Freq: Four times a day (QID) | RECTAL | Status: DC | PRN

## 2017-07-13 MED ORDER — CEFADROXIL 500 MG PO CAPS
500.0000 mg | ORAL_CAPSULE | Freq: Two times a day (BID) | ORAL | Status: DC
  Administered 2017-07-13: 500 mg via ORAL
  Filled 2017-07-13 (×2): qty 1

## 2017-07-13 MED ORDER — LEVALBUTEROL HCL 0.63 MG/3ML IN NEBU: 0.6300 mg | INHALATION_SOLUTION | Freq: Four times a day (QID) | RESPIRATORY_TRACT | Status: DC | PRN

## 2017-07-13 MED ORDER — FINASTERIDE 5 MG PO TABS
5.0000 mg | ORAL_TABLET | ORAL | Status: DC
  Administered 2017-07-14: 5 mg via ORAL
  Filled 2017-07-13: qty 1

## 2017-07-13 MED ORDER — PANTOPRAZOLE SODIUM 40 MG IV SOLR
40.0000 mg | Freq: Two times a day (BID) | INTRAVENOUS | Status: DC
  Administered 2017-07-13 – 2017-07-14 (×2): 40 mg via INTRAVENOUS
  Filled 2017-07-13 (×2): qty 40

## 2017-07-13 MED ORDER — CEFADROXIL 500 MG/5ML PO SUSR: 500.0000 mg | Freq: Two times a day (BID) | ORAL | Status: DC

## 2017-07-13 MED ORDER — TRAZODONE HCL 50 MG PO TABS: 50.0000 mg | ORAL_TABLET | Freq: Every evening | ORAL | Status: DC | PRN

## 2017-07-13 MED ORDER — METOPROLOL SUCCINATE ER 100 MG PO TB24
100.0000 mg | ORAL_TABLET | ORAL | Status: DC
  Administered 2017-07-14: 100 mg via ORAL
  Filled 2017-07-13: qty 1

## 2017-07-13 MED ORDER — ONDANSETRON HCL 4 MG/2ML IJ SOLN: 4.0000 mg | Freq: Four times a day (QID) | INTRAMUSCULAR | Status: DC | PRN

## 2017-07-13 MED ORDER — ACETAMINOPHEN 325 MG PO TABS: 650.0000 mg | ORAL_TABLET | Freq: Four times a day (QID) | ORAL | Status: DC | PRN

## 2017-07-13 MED ORDER — ONDANSETRON HCL 4 MG PO TABS: 4.0000 mg | ORAL_TABLET | Freq: Four times a day (QID) | ORAL | Status: DC | PRN

## 2017-07-13 MED ORDER — SODIUM CHLORIDE 0.9 % IV SOLN
INTRAVENOUS | Status: DC
  Administered 2017-07-13 – 2017-07-14 (×2): via INTRAVENOUS

## 2017-07-13 NOTE — Consult Note (Signed)
Consultation  Referring Provider:  [facility]  Primary Care Physician:  Georgianne Fick, MD Primary Gastroenterologist:  none.  Reason for Consultation:  GI bleed  HPI: William Nicholson is a 83 y.o. male  who is being admitted through the emergency room this afternoon, after found at home this morning with what was described as a lot of blood in the bed and in his depends. This was described as dark red blood. Family who has been doing total care for this patient, says that he has not had any bleeding prior to today. He has not had a bowel movement for further bleeding since onset this morning. Patient is a very poor historian and answers in one-word answers. He denies any discomfort. His daughter states that he does not have any history of GI bleeding. She is unaware of him having any GI evaluation or colonoscopy dating back at least 65. Patient has history of hypertension and atrial fibrillation and has been maintained on anticoagulation over the past several years. He is currently on eliquis. Patient is been hemodynamically stable in the emergency room again with no active bleeding since arrival. Per ER M.D. stool was brown and heme positive on rectal exam.  Hemoglobin is 11.5, hematocrit of 32.5, MCV of 95, platelets 109. Hemoglobin was 12.7 on 06/23/2017 and 10.6 on April 2018. BUN normal at 13, creatinine 1.2, INR 1.24 No prior abdominal imaging available in [facility]. Patient also had a recent ER visit on 06/23/2017, brought in by family for fatigue and poor appetite. He has been diagnosed recently with dementia. He was diagnosed with UTI at the time of ER visit on 06/23/2017 and was treated with Rocephin, and Duricef suspension.   Past Medical History:  Diagnosis Date  . Atrial fibrillation (HCC)   . Hypertension     Past Surgical History:  Procedure Laterality Date  . PROSTATE SURGERY  2012    Prior to Admission medications   Medication Sig Start Date End Date  Taking? Authorizing Provider  cefadroxil (DURICEF) 500 MG/5ML suspension Take 500 mg by mouth 2 (two) times daily.   Yes [provider]  ELIQUIS 2.5 MG TABS tablet Take 2.5 mg by mouth 2 (two) times daily.  06/22/17  Yes [provider]  finasteride (PROSCAR) 5 MG tablet Take 5 mg by mouth every morning.  02/16/16  Yes [provider]  metoprolol succinate (TOPROL-XL) 100 MG 24 hr tablet Take 100 mg by mouth every morning.  11/09/14  Yes [provider]  traZODone (DESYREL) 50 MG tablet Take 50 mg by mouth at bedtime as needed for sleep.   Yes [provider]    Current Facility-Administered Medications  Medication Dose Route Frequency Provider Last Rate Last Dose  . 0.9 %  sodium chloride infusion   Intravenous Continuous Richarda Overlie, MD      . acetaminophen (TYLENOL) tablet 650 mg  650 mg Oral Q6H PRN Richarda Overlie, MD       Or  . acetaminophen (TYLENOL) suppository 650 mg  650 mg Rectal Q6H PRN Richarda Overlie, MD      . levalbuterol (XOPENEX) nebulizer solution 0.63 mg  0.63 mg Nebulization Q6H PRN Richarda Overlie, MD      . ondansetron (ZOFRAN) tablet 4 mg  4 mg Oral Q6H PRN Richarda Overlie, MD       Or  . ondansetron (ZOFRAN) injection 4 mg  4 mg Intravenous Q6H PRN Richarda Overlie, MD      . pantoprazole (PROTONIX)  injection 40 mg  40 mg Intravenous Q12H Richarda Overlie, MD       Current Outpatient Prescriptions  Medication Sig Dispense Refill  . cefadroxil (DURICEF) 500 MG/5ML suspension Take 500 mg by mouth 2 (two) times daily.    Marland Kitchen ELIQUIS 2.5 MG TABS tablet Take 2.5 mg by mouth 2 (two) times daily.     . finasteride (PROSCAR) 5 MG tablet Take 5 mg by mouth every morning.   11  . metoprolol succinate (TOPROL-XL) 100 MG 24 hr tablet Take 100 mg by mouth every morning.   6  . traZODone (DESYREL) 50 MG tablet Take 50 mg by mouth at bedtime as needed for sleep.      Allergies as of 07/13/2017  . (No Known Allergies)    Family History  Problem  Relation Age of Onset  . Dementia Neg Hx     Social History   Social History  . Marital status: Widowed    Spouse name: N/A  . Number of children: 3  . Years of education: N/A   Occupational History  . Not on file.   Social History Main Topics  . Smoking status: Never Smoker  . Smokeless tobacco: Never Used  . Alcohol use No  . Drug use: Unknown  . Sexual activity: Not on file   Other Topics Concern  . Not on file   Social History Narrative   Lives at home, children care for him   Caffeine: none    Review of Systems: Pt offers no hx , denies pain.  Physical Exam: Vital signs in last 24 hours: Temp:  [97.4 F (36.3 C)] 97.4 F (36.3 C) (09/26 1351) Pulse Rate:  [70-112] 90 (09/26 1700) Resp:  [18] 18 (09/26 1613) BP: (122-152)/(80-110) 122/80 (09/26 1700) SpO2:  [99 %-100 %] 100 % (09/26 1700)   General:   Alert,  Well-developed, Very elderly African-American male, curled up on his side, and annoyed by exam Head:  Normocephalic and atraumatic. Eyes:  Sclera clear, no icterus.   Conjunctiva pink. Ears:  Normal auditory acuity. Nose:  No deformity, discharge,  or lesions. Mouth:  No deformity or lesions, dentition in very poor   Neck:  Supple; no masses or thyromegaly. Lungs:  Clear throughout to auscultation.   No wheezes, crackles, or rhonchi. Heart:  Regular rate and rhythm; no murmurs, clicks, rubs,  or gallops. Abdomen:  Soft,nontender, BS active,nonpalp mass or hsm.   Rectal:  Deferred -no external hemorrhoids or lesions noted, and digital exam was done by ER M.D. earlier today Msk:  Symmetrical without gross deformities. . Pulses:  Normal pulses noted. Extremities:  Without clubbing or edema. Neurologic:  Alert , not oriented to time or place, answers yes or no Skin:  Intact without significant lesions or rashes..   Intake/Output from previous day: No intake/output data recorded. Intake/Output this shift: No intake/output data recorded.  Lab  Results:  Recent Labs  07/13/17 1155  WBC 4.1  HGB 11.5*  HCT 32.5*  PLT 109*   BMET  Recent Labs  07/13/17 1155  NA 134*  K 4.1  CL 104  CO2 25  GLUCOSE 92  BUN 13  CREATININE 1.20  CALCIUM 9.0   LFT  Recent Labs  07/13/17 1155  PROT 6.5  ALBUMIN 3.2*  AST 20  ALT 12*  ALKPHOS 79  BILITOT 0.9   PT/INR  Recent Labs  07/13/17 1155  LABPROT 15.5*  INR 1.24   Hepatitis Panel No results for input(s): HEPBSAG,  HCVAB, HEPAIGM, HEPBIGM in the last 72 hours.   IMPRESSION:   #72 83 year old African-American male admitted with an episode of grossly bloody stool 1 in setting of chronic anticoagulation with eliquis. Patient is hemodynamically stable, and thus far has not had any further active bleeding. Bleeding suspected lower, with normal BUN, and painless-rule out self-limited diverticular bleed, rule out occult colon lesion. #2 mild normocytic anemia #3 thrombocytopenia #4 history of atrial fibrillation #5 history of hypertension #6 dementia #7 recent UTI  Plan; clear liquid diet Serial hemoglobins and transfuse for hemoglobin 8 or less Stop eliquis. Patient patient's daughter is comfortable with not on aggressive approach at this time, and they would like to avoid procedures if possible. It is not clear to me whether they have established CODE STATUS for patient. At his very advanced age, with new onset of bleeding would favor stopping eliquis permanently. He would benefit from palliative care consultation. We will follow along but do not plan endoscopic evaluation at this time.    Zaydee Aina  07/13/2017, 5:20 PM

## 2017-07-13 NOTE — ED Notes (Signed)
Call report to [place] at [phone] at [phone]

## 2017-07-13 NOTE — ED Provider Notes (Signed)
[facility]-EMERGENCY DEPT Provider Note   CSN: [id number] Arrival date & time: 07/13/17  1029     History   Chief Complaint Chief Complaint  Patient presents with  . Diarrhea  . Rectal Bleeding    HPI William Nicholson is a 83 y.o. male.  HPI   83 year old male here with rectal bleeding. The patient has a history of previous GI bleeds as well as atrial fibrillation on Eliquis. According to the daughter's report, the patient was found with large amount of red, bloody stool on his sheets this morning.  He has since had loose, but not grossly bloody stools. He has been generally more tired than usual, but otherwise in his baseline set of health. No recent falls. No signs of hematemesis. Currently, he is without complaints. He states he feels cold but is otherwise denies any abdominal pain. Denies any recent trauma.  Past Medical History:  Diagnosis Date  . Atrial fibrillation (HCC)   . Hypertension     Patient Active Problem List   Diagnosis Date Noted  . Lower GI bleed 07/13/2017  . Anticoagulated   . Dysphagia   . Dementia with behavioral disturbance   . Encephalopathy 02/07/2017  . Acute encephalopathy 02/06/2017  . Hypertension 02/06/2017  . BPH (benign prostatic hyperplasia) 02/06/2017  . Atrial fibrillation (HCC) 02/06/2017  . Thrombocytopenia (HCC) 02/06/2017  . AKI (acute kidney injury) (HCC) 02/06/2017    Past Surgical History:  Procedure Laterality Date  . PROSTATE SURGERY  2012       Home Medications    Prior to Admission medications   Medication Sig Start Date End Date Taking? Authorizing Provider  cefadroxil (DURICEF) 500 MG/5ML suspension Take 500 mg by mouth 2 (two) times daily.   Yes [provider]  ELIQUIS 2.5 MG TABS tablet Take 2.5 mg by mouth 2 (two) times daily.  06/22/17  Yes [provider]  finasteride (PROSCAR) 5 MG tablet Take 5 mg by mouth every morning.  02/16/16  Yes [provider]  metoprolol succinate  (TOPROL-XL) 100 MG 24 hr tablet Take 100 mg by mouth every morning.  11/09/14  Yes [provider]  traZODone (DESYREL) 50 MG tablet Take 50 mg by mouth at bedtime as needed for sleep.   Yes [provider]    Family History Family History  Problem Relation Age of Onset  . Dementia Neg Hx     Social History Social History  Substance Use Topics  . Smoking status: Never Smoker  . Smokeless tobacco: Never Used  . Alcohol use No     Allergies   Patient has no known allergies.   Review of Systems Review of Systems  Unable to perform ROS: Dementia     Physical Exam Updated Vital Signs BP (!) 151/88 (BP Location: Right Arm)   Pulse 87   Temp 99 F (37.2 C) (Oral)   Resp 16   Ht 6' (1.829 m)   Wt 43.9 kg (96 lb 12.5 oz)   SpO2 100%   BMI 13.13 kg/m   Physical Exam  Constitutional: He appears well-developed and well-nourished. No distress.  HENT:  Head: Normocephalic and atraumatic.  Eyes: Conjunctivae are normal.  Neck: Neck supple.  Cardiovascular: Normal rate, regular rhythm and normal heart sounds.  Exam reveals no friction rub.   No murmur heard. Pulmonary/Chest: Effort normal and breath sounds normal. No respiratory distress. He has no wheezes. He has no rales.  Abdominal: He exhibits no distension. There is no tenderness.  Genitourinary:  Genitourinary Comments: Soft brown stool in rectal vault, Hemoccult positive  Musculoskeletal: He exhibits no edema.  Neurological: He is alert.  Answers in one-word sentences, which is baseline. Not oriented to person, place, or time, which is also baseline. Moves all extremities with 5 out of 5 strength.  Skin: Skin is warm. Capillary refill takes less than 2 seconds.  Psychiatric: He has a normal mood and affect.  Nursing note and vitals reviewed.    ED Treatments / Results  Labs (all labs ordered are listed, but only abnormal results are displayed) Labs Reviewed  CBC WITH DIFFERENTIAL/PLATELET -  Abnormal; Notable for the following:       Result Value   RBC 3.40 (*)    Hemoglobin 11.5 (*)    HCT 32.5 (*)    Platelets 109 (*)    All other components within normal limits  COMPREHENSIVE METABOLIC PANEL - Abnormal; Notable for the following:    Sodium 134 (*)    Albumin 3.2 (*)    ALT 12 (*)    GFR calc non Af Amer 47 (*)    GFR calc Af Amer 55 (*)    All other components within normal limits  PROTIME-INR - Abnormal; Notable for the following:    Prothrombin Time 15.5 (*)    All other components within normal limits  CBC - Abnormal; Notable for the following:    RBC 3.50 (*)    Hemoglobin 12.0 (*)    HCT 33.8 (*)    MCH 34.3 (*)    Platelets 111 (*)    All other components within normal limits  POC OCCULT BLOOD, ED - Abnormal; Notable for the following:    Fecal Occult Bld POSITIVE (*)    All other components within normal limits  URINALYSIS, ROUTINE W REFLEX MICROSCOPIC  COMPREHENSIVE METABOLIC PANEL  PROTIME-INR  CBC  CBC  POC OCCULT BLOOD, ED  I-STAT CG4 LACTIC ACID, ED  TYPE AND SCREEN    EKG  EKG Interpretation None       Radiology No results found.  Procedures Procedures (including critical care time)  Medications Ordered in ED Medications  traZODone (DESYREL) tablet 50 mg (not administered)  finasteride (PROSCAR) tablet 5 mg (not administered)  metoprolol succinate (TOPROL-XL) 24 hr tablet 100 mg (not administered)  0.9 %  sodium chloride infusion ( Intravenous New Bag/Given 07/13/17 1814)  acetaminophen (TYLENOL) tablet 650 mg (not administered)    Or  acetaminophen (TYLENOL) suppository 650 mg (not administered)  ondansetron (ZOFRAN) tablet 4 mg (not administered)    Or  ondansetron (ZOFRAN) injection 4 mg (not administered)  levalbuterol (XOPENEX) nebulizer solution 0.63 mg (not administered)  pantoprazole (PROTONIX) injection 40 mg (40 mg Intravenous Given 07/13/17 2014)  cefadroxil (DURICEF) capsule 500 mg (500 mg Oral Given 07/13/17 2141)       Initial Impression / Assessment and Plan / ED Course  I have reviewed the triage vital signs and the nursing notes.  Pertinent labs & imaging results that were available during my care of the patient were reviewed by me and considered in my medical decision making (see chart for details).     83 year old male with past medical history as above, most notable for Eliquis use, here with bloody stool x 1. Soft brown stool on exam but hemoccult is positive. Hgb stable, tp o/w HDS and well appearing. Will admit for high risk, likely LGIB in setting of diarrhea on blood thinners. No focal abd TTP to suggest acute diverticulitis  or surgical etiology. WBC normal. Do not feel CT imaging indicated based on reassuring labs, exam at this time.  Final Clinical Impressions(s) / ED Diagnoses   Final diagnoses:  Gastrointestinal hemorrhage, unspecified gastrointestinal hemorrhage type    New Prescriptions Current Discharge Medication List       Shaune Pollack, MD 07/13/17 2256

## 2017-07-13 NOTE — H&P (Signed)
[facility] History and Physical  NETTIE CROMWELL MWU:[id number] DOB: [birth year] DOA: 07/13/2017  Referring physician:  PCP: Georgianne Fick, MD   Chief Complaint:  GI bleeding  HPI:  83 -year-old male with a history of dementia, dysphagia, hypertension, atrial fibrillation on eliquis , brought in by his daughter due to evidence of bright red blood per rectum 1 this morning. Patient has also been  somnolent, not eating or drinking much. Patient has dementia and does not provide much meaningful history, although can expresses discomfort. He has not been very communicative lately. Patient was recently being treated with an antibiotic for a urinary tract infection  History is obtained from patient's daughter is by the bedside ED course BP (!) 141/115 (BP Location: Left Arm)   Pulse (!) 106   Temp (!) 97.5 F (36.4 C) (Axillary)   Resp 13   SpO2 100%  Hemoglobin 12.7, FOBT positive No further episodes of bright blood per rectum in the ED [facility] called for evaluation by EDP Patient is being admitted for dehydration, GI bleed, urinary tract infection    Review of Systems: negative for the following   Unable to obtain review of systems due to dementia      Past Medical History:  Diagnosis Date  . Atrial fibrillation (HCC)   . Hypertension      Past Surgical History:  Procedure Laterality Date  . PROSTATE SURGERY  2012      Social History:  reports that he has never smoked. He has never used smokeless tobacco. He reports that he does not drink alcohol. His drug history is not on file.    No Known Allergies  Family History  Problem Relation Age of Onset  . Dementia Neg Hx         Prior to Admission medications   Medication Sig Start Date End Date Taking? Authorizing Provider  cefadroxil (DURICEF) 500 MG/5ML suspension Take 500 mg by mouth 2 (two) times daily.   Yes [provider]  ELIQUIS 2.5 MG TABS tablet Take 2.5 mg by mouth 2 (two)  times daily.  06/22/17  Yes [provider]  finasteride (PROSCAR) 5 MG tablet Take 5 mg by mouth every morning.  02/16/16  Yes [provider]  metoprolol succinate (TOPROL-XL) 100 MG 24 hr tablet Take 100 mg by mouth every morning.  11/09/14  Yes [provider]  traZODone (DESYREL) 50 MG tablet Take 50 mg by mouth at bedtime as needed for sleep.   Yes [provider]     Physical Exam: Vitals:   07/13/17 1103 07/13/17 1351  BP: (!) 152/110 127/90  Pulse: (!) 112 83  Resp: 18 18  Temp: (!) 97.4 F (36.3 C) (!) 97.4 F (36.3 C)  TempSrc: Oral Oral  SpO2: 100% 99%        Vitals:   07/13/17 1103 07/13/17 1351  BP: (!) 152/110 127/90  Pulse: (!) 112 83  Resp: 18 18  Temp: (!) 97.4 F (36.3 C) (!) 97.4 F (36.3 C)  TempSrc: Oral Oral  SpO2: 100% 99%   Constitutional: Elderly, frail, thin  Eyes: PERRL, lids and conjunctivae normal ENMT: Mucous membranes are dry. Posterior pharynx clear of any exudate or lesions.Normal dentition.  Neck: normal, supple, no masses, no thyromegaly Respiratory: clear to auscultation bilaterally, no wheezing, no crackles. Normal respiratory effort. No accessory muscle use.  Cardiovascular: Regular rate and rhythm, no murmurs / rubs / gallops. No extremity edema. 2+ pedal pulses. No carotid bruits.  Abdomen: no tenderness, no masses palpated. No hepatosplenomegaly. Bowel sounds positive.  Musculoskeletal: no clubbing / cyanosis. No joint deformity upper and lower extremities. Good ROM, no contractures. Normal muscle tone.  Skin: no rashes, lesions, ulcers. No induration Neurological: He is alert. No cranial nerve deficit (no facial droop, extraocular movements intact, no slurred speech) or sensory deficit. He exhibits normal muscle tone. He displays no seizure activity. GCS eye subscore is 4. GCS verbal subscore is 4. GCS motor subscore is 6.  Generalized weakness although patient will move all extremities, he does not  lift his legs or arms off the bed Psychiatric: Normal judgment and insight. Alert and oriented x 3. Normal mood.     Labs on Admission: I have personally reviewed following labs and imaging studies  CBC:  Recent Labs Lab 07/13/17 1155  WBC 4.1  NEUTROABS 2.4  HGB 11.5*  HCT 32.5*  MCV 95.6  PLT 109*    Basic Metabolic Panel:  Recent Labs Lab 07/13/17 1155  NA 134*  K 4.1  CL 104  CO2 25  GLUCOSE 92  BUN 13  CREATININE 1.20  CALCIUM 9.0    GFR: CrCl cannot be calculated (Unknown ideal weight.).  Liver Function Tests:  Recent Labs Lab 07/13/17 1155  AST 20  ALT 12*  ALKPHOS 79  BILITOT 0.9  PROT 6.5  ALBUMIN 3.2*   No results for input(s): LIPASE, AMYLASE in the last 168 hours. No results for input(s): AMMONIA in the last 168 hours.  Coagulation Profile:  Recent Labs Lab 07/13/17 1155  INR 1.24   No results for input(s): DDIMER in the last 72 hours.  Cardiac Enzymes: No results for input(s): CKTOTAL, CKMB, CKMBINDEX, TROPONINI in the last 168 hours.  BNP (last 3 results) No results for input(s): PROBNP in the last 8760 hours.  HbA1C: No results for input(s): HGBA1C in the last 72 hours. No results found for: HGBA1C   CBG: No results for input(s): GLUCAP in the last 168 hours.  Lipid Profile: No results for input(s): CHOL, HDL, LDLCALC, TRIG, CHOLHDL, LDLDIRECT in the last 72 hours.  Thyroid Function Tests: No results for input(s): TSH, T4TOTAL, FREET4, T3FREE, THYROIDAB in the last 72 hours.  Anemia Panel: No results for input(s): VITAMINB12, FOLATE, FERRITIN, TIBC, IRON, RETICCTPCT in the last 72 hours.  Urine analysis:    Component Value Date/Time   COLORURINE YELLOW 06/23/2017 2053   APPEARANCEUR HAZY (A) 06/23/2017 2053   LABSPEC 1.015 06/23/2017 2053   PHURINE 5.0 06/23/2017 2053   GLUCOSEU NEGATIVE 06/23/2017 2053   HGBUR SMALL (A) 06/23/2017 2053   BILIRUBINUR NEGATIVE 06/23/2017 2053   KETONESUR NEGATIVE 06/23/2017  2053   PROTEINUR 30 (A) 06/23/2017 2053   UROBILINOGEN 0.2 10/15/2010 1614   NITRITE NEGATIVE 06/23/2017 2053   LEUKOCYTESUR TRACE (A) 06/23/2017 2053    Sepsis Labs: (procalcitonin:4,lacticidven:4) )No results found for this or any previous visit (from the past 240 hour(s)).       Radiological Exams on Admission: No results found. Dg Chest 2 View  Result Date: 06/23/2017 CLINICAL DATA:  83 year old with weakness. EXAM: CHEST  2 VIEW COMPARISON:  Radiographs 02/06/2017 FINDINGS: Chronic hyperinflation and interstitial coarsening. Decreased cardiomegaly from prior exam. Mediastinal contours are unchanged. No pulmonary edema. Possible tiny pleural effusions. No confluent airspace disease. No pneumothorax. No acute osseous abnormality. IMPRESSION: 1. Chronic hyperinflation and interstitial coarsening. 2. Possible small pleural effusions. 3. Decreased cardiomegaly from prior. Electronically Signed   By: Rubye Oaks M.D.   On: 06/23/2017  19:27      EKG: Independently reviewed. Atrial fibrillation Left anterior fascicular block   Assessment/Plan Principal Problem:   GI bleed Likely diverticular Cannot rule out upper GI bleeding Patient will be admitted to telemetry Hemoglobin currently stable, at baseline INR 1.24, Serial CBC, anemia panel Start patient on PPI GI evaluation, assess need for a bleeding scan if the bleeding recurs May not be a Good candidate for invasive procedures      Hypertension-continue metoprolol    BPH (benign prostatic hyperplasia)-continue Proscar    Atrial fibrillation (HCC)-hold anticoagulation for the time being, continue metoprolol, rate control    Thrombocytopenia (HCC)-likely account 111-137 at baseline, now 109, will follow    Dementia with behavioral disturbance continue trazodone       DVT prophylaxis:  SCDs     Code Status Orders full code        consults called: GI   Family Communication: Admission, patients  condition and plan of care including tests being ordered have been discussed with the patient  who indicates understanding and agree with the plan and Code Status  Admission status: inpatient   Disposition plan: Further plan will depend as patient's clinical course evolves and further radiologic and laboratory data become available. Likely home when stable   At the time of admission, it appears that the appropriate admission status for this patient is INPATIENT .Thisis judged to be reasonable and necessary in order to provide the required intensity of service to ensure the patient's safetygiven thepresenting symptoms, physical exam findings, and initial radiographic and laboratory data in the context of their chronic comorbidities.   Richarda Overlie MD [facility] Pager [phone]  If 7PM-7AM, please contact night-coverage [email or web address] Password [facility]  07/13/2017, 3:38 PM

## 2017-07-13 NOTE — ED Notes (Signed)
Transporter notified about room assignment on 4th floor and patient ready to transport.

## 2017-07-13 NOTE — ED Triage Notes (Signed)
Per EMS, pt's daughter states pt had diarrhea and pinkish blood tinged stool. Pt is currently being treated for UTI. Pt stays with daughter at home. Pt is at baseline mental status.   143/90 HR 80-90 a fib CBG 92 T 99

## 2017-07-14 DIAGNOSIS — K922 Gastrointestinal hemorrhage, unspecified: Secondary | ICD-10-CM | POA: Diagnosis present

## 2017-07-14 DIAGNOSIS — K920 Hematemesis: Secondary | ICD-10-CM | POA: Diagnosis not present

## 2017-07-14 DIAGNOSIS — Z7901 Long term (current) use of anticoagulants: Secondary | ICD-10-CM | POA: Diagnosis not present

## 2017-07-14 LAB — COMPREHENSIVE METABOLIC PANEL
ALT: 11 U/L — ABNORMAL LOW (ref 17–63)
ANION GAP: 5 (ref 5–15)
AST: 16 U/L (ref 15–41)
Albumin: 2.9 g/dL — ABNORMAL LOW (ref 3.5–5.0)
Alkaline Phosphatase: 72 U/L (ref 38–126)
BILIRUBIN TOTAL: 0.8 mg/dL (ref 0.3–1.2)
BUN: 11 mg/dL (ref 6–20)
CO2: 22 mmol/L (ref 22–32)
Calcium: 8.6 mg/dL — ABNORMAL LOW (ref 8.9–10.3)
Chloride: 108 mmol/L (ref 101–111)
Creatinine, Ser: 0.94 mg/dL (ref 0.61–1.24)
GFR calc Af Amer: >60 mL/min (ref >60)
Glucose, Bld: 81 mg/dL (ref 65–99)
POTASSIUM: 3.8 mmol/L (ref 3.5–5.1)
Sodium: 135 mmol/L (ref 135–145)
TOTAL PROTEIN: 6 g/dL — AB (ref 6.5–8.1)

## 2017-07-14 LAB — PROTIME-INR
INR: 1.29
PROTHROMBIN TIME: 15.9 s — AB (ref 11.4–15.2)

## 2017-07-14 LAB — CBC
HCT: 32 % — ABNORMAL LOW (ref 39.0–52.0)
HEMATOCRIT: 30.8 % — AB (ref 39.0–52.0)
Hemoglobin: 11 g/dL — ABNORMAL LOW (ref 13.0–17.0)
Hemoglobin: 11.6 g/dL — ABNORMAL LOW (ref 13.0–17.0)
MCH: 34.5 pg — ABNORMAL HIGH (ref 26.0–34.0)
MCH: 34.4 pg — AB (ref 26.0–34.0)
MCHC: 35.7 g/dL (ref 30.0–36.0)
MCHC: 36.3 g/dL — ABNORMAL HIGH (ref 30.0–36.0)
MCV: 95 fL (ref 78.0–100.0)
MCV: 96.6 fL (ref 78.0–100.0)
PLATELETS: 103 10*3/uL — AB (ref 150–400)
PLATELETS: 104 10*3/uL — AB (ref 150–400)
RBC: 3.19 MIL/uL — ABNORMAL LOW (ref 4.22–5.81)
RBC: 3.37 MIL/uL — AB (ref 4.22–5.81)
RDW: 12.2 % (ref 11.5–15.5)
RDW: 12.3 % (ref 11.5–15.5)
WBC: 3.5 10*3/uL — AB (ref 4.0–10.5)
WBC: 3.7 10*3/uL — AB (ref 4.0–10.5)

## 2017-07-14 NOTE — Discharge Summary (Signed)
Physician Discharge Summary  JORGE RETZ ZOX:[id number] DOB: 1913/02/10 DOA: 07/13/2017  PCP: Georgianne Fick, MD  Admit date: 07/13/2017 Discharge date: 07/14/2017  Admitted From: Home Disposition:  Home  Recommendations for Outpatient Follow-up:  1. Follow up with PCP in 1 week 2. Would recommend outpatient palliative care referral with possible hospice referral.   Discharge Condition: Stable CODE STATUS: DNR, confirmed with HCPOA  Diet recommendations: Dysphagia 2 (fine chop);Thin liquid Liquids provided via: Cup;Straw Medication Administration: Crushed with puree Supervision: Staff to assist with self feeding Compensations: Minimize environmental distractions;Slow rate;Small sips/bites  Brief/Interim Summary: William Nicholson is a 83 yo male with past medical history of dementia, dysphagia, HTN, A Fib on Eliquis who resides at home with daughter. He was brought to the hospital by daughter after 1 episode of bright red blood per rectum. On evaluation, patient's hemoglobin was 12.7, FOBT was positive. GI was consulted, who recommended conservative therapy for possible diverticular bleed. Eliquis was stopped. Patient was observed, without any further episodes of bright red blood per rectum. Hemoglobin has remained stable. In this 83 year old patient with dementia, aggressive and invasive procedure was deferred. Family was in agreement.  Discharge Diagnoses:  Principal Problem:   Lower GI bleed Active Problems:   Hypertension   BPH (benign prostatic hyperplasia)   Atrial fibrillation (HCC)   Thrombocytopenia (HCC)   Dementia with behavioral disturbance   Anticoagulated   Rectal bleed -?Diverticular in etiology -Hold Eliquis indefinitely -GI consulted, no plan for procedures -No further bleeding episodes in hospital  -Hgb remained stable -BP remained stable  Chronic A Fib  -Hold Eliquis indefinitely  -Continue metoprolol   Dementia -Per daughter, patient has been  progressively declining at home   Recent dx UTI -Finish course of antibiotics   BPH -Continue proscar     Discharge Instructions  Discharge Instructions    Call MD for:  difficulty breathing, headache or visual disturbances    Complete by:  As directed    Call MD for:  extreme fatigue    Complete by:  As directed    Call MD for:  hives    Complete by:  As directed    Call MD for:  persistant dizziness or light-headedness    Complete by:  As directed    Call MD for:  persistant nausea and vomiting    Complete by:  As directed    Call MD for:  severe uncontrolled pain    Complete by:  As directed    Call MD for:  temperature >100.4    Complete by:  As directed    Increase activity slowly    Complete by:  As directed      Allergies as of 07/14/2017   No Known Allergies     Medication List    STOP taking these medications   ELIQUIS 2.5 MG Tabs tablet Generic drug:  apixaban     TAKE these medications   cefadroxil 500 MG/5ML suspension Commonly known as:  DURICEF Take 500 mg by mouth 2 (two) times daily.   finasteride 5 MG tablet Commonly known as:  PROSCAR Take 5 mg by mouth every morning.   metoprolol succinate 100 MG 24 hr tablet Commonly known as:  TOPROL-XL Take 100 mg by mouth every morning.   traZODone 50 MG tablet Commonly known as:  DESYREL Take 50 mg by mouth at bedtime as needed for sleep.            Discharge Care Instructions  Start     Ordered   07/14/17 0000  Increase activity slowly     07/14/17 1231   07/14/17 0000  Call MD for:  extreme fatigue     07/14/17 1231   07/14/17 0000  Call MD for:  persistant dizziness or light-headedness     07/14/17 1231   07/14/17 0000  Call MD for:  hives     07/14/17 1231   07/14/17 0000  Call MD for:  difficulty breathing, headache or visual disturbances     07/14/17 1231   07/14/17 0000  Call MD for:  severe uncontrolled pain     07/14/17 1231   07/14/17 0000  Call MD for:  persistant  nausea and vomiting     07/14/17 1231   07/14/17 0000  Call MD for:  temperature >100.4     07/14/17 1231     Follow-up Information    Georgianne Fick, MD. Schedule an appointment as soon as possible for a visit in 1 week(s).   Specialty:  Internal Medicine Contact information: [address] [place] [address] [phone]          No Known Allergies  Consultations:  GI   Procedures/Studies: Dg Chest 2 View  Result Date: 06/23/2017 CLINICAL DATA:  83 year old with weakness. EXAM: CHEST  2 VIEW COMPARISON:  Radiographs 02/06/2017 FINDINGS: Chronic hyperinflation and interstitial coarsening. Decreased cardiomegaly from prior exam. Mediastinal contours are unchanged. No pulmonary edema. Possible tiny pleural effusions. No confluent airspace disease. No pneumothorax. No acute osseous abnormality. IMPRESSION: 1. Chronic hyperinflation and interstitial coarsening. 2. Possible small pleural effusions. 3. Decreased cardiomegaly from prior. Electronically Signed   By: Rubye Oaks M.D.   On: 06/23/2017 19:27       Discharge Exam: Vitals:   07/13/17 2024 07/14/17 0607  BP: (!) 151/88 (!) 153/97  Pulse: 87 80  Resp: 16 16  Temp: 99 F (37.2 C) 97.7 F (36.5 C)  SpO2: 100% 100%   Vitals:   07/13/17 1700 07/13/17 1810 07/13/17 2024 07/14/17 0607  BP: 122/80 (!) 130/94 (!) 151/88 (!) 153/97  Pulse: 90  87 80  Resp:  Temp:  98.5 F (36.9 C) 99 F (37.2 C) 97.7 F (36.5 C)  TempSrc:  Axillary Oral Oral  SpO2: 100% 100% 100% 100%  Weight:  43.9 kg (96 lb 12.5 oz)    Height:  6' (1.829 m)      General: Pt is somnolent  Cardiovascular: irregular, S1/S2 +, no rubs, no gallops Respiratory: CTA bilaterally, no wheezing, no rhonchi Abdominal: Soft, NT, ND, bowel sounds + Extremities: no edema, no cyanosis    The results of significant diagnostics from this hospitalization (including imaging, microbiology, ancillary and laboratory) are  listed below for reference.     Microbiology: No results found for this or any previous visit (from the past 240 hour(s)).   Labs: BNP (last 3 results) No results for input(s): BNP in the last 8760 hours. Basic Metabolic Panel:  Recent Labs Lab 07/13/17 1155 07/14/17 0530  NA 134* 135  K 4.1 3.8  CL 104 108  CO2 25 22  GLUCOSE 92 81  BUN 13 11  CREATININE 1.20 0.94  CALCIUM 9.0 8.6*   Liver Function Tests:  Recent Labs Lab 07/13/17 1155 07/14/17 0530  AST 20 16  ALT 12* 11*  ALKPHOS 79 72  BILITOT 0.9 0.8  PROT 6.5 6.0*  ALBUMIN 3.2* 2.9*   No results for input(s): LIPASE, AMYLASE  in the last 168 hours. No results for input(s): AMMONIA in the last 168 hours. CBC:  Recent Labs Lab 07/13/17 1155 07/13/17 1840 07/13/17 2354 07/14/17 0530  WBC 4.1 4.2 3.5* 3.7*  NEUTROABS 2.4  --   --   --   HGB 11.5* 12.0* 11.6* 11.0*  HCT 32.5* 33.8* 32.0* 30.8*  MCV 95.6 96.6 95.0 96.6  PLT 109* 111* 103* 104*   Cardiac Enzymes: No results for input(s): CKTOTAL, CKMB, CKMBINDEX, TROPONINI in the last 168 hours. BNP: Invalid input(s): POCBNP CBG: No results for input(s): GLUCAP in the last 168 hours. D-Dimer No results for input(s): DDIMER in the last 72 hours. Hgb A1c No results for input(s): HGBA1C in the last 72 hours. Lipid Profile No results for input(s): CHOL, HDL, LDLCALC, TRIG, CHOLHDL, LDLDIRECT in the last 72 hours. Thyroid function studies No results for input(s): TSH, T4TOTAL, T3FREE, THYROIDAB in the last 72 hours.  Invalid input(s): FREET3 Anemia work up No results for input(s): VITAMINB12, FOLATE, FERRITIN, TIBC, IRON, RETICCTPCT in the last 72 hours. Urinalysis    Component Value Date/Time   COLORURINE YELLOW 06/23/2017 2053   APPEARANCEUR HAZY (A) 06/23/2017 2053   LABSPEC 1.015 06/23/2017 2053   PHURINE 5.0 06/23/2017 2053   GLUCOSEU NEGATIVE 06/23/2017 2053   HGBUR SMALL (A) 06/23/2017 2053   BILIRUBINUR NEGATIVE 06/23/2017 2053    KETONESUR NEGATIVE 06/23/2017 2053   PROTEINUR 30 (A) 06/23/2017 2053   UROBILINOGEN 0.2 10/15/2010 1614   NITRITE NEGATIVE 06/23/2017 2053   LEUKOCYTESUR TRACE (A) 06/23/2017 2053   Sepsis Labs Invalid input(s): PROCALCITONIN,  WBC,  LACTICIDVEN Microbiology No results found for this or any previous visit (from the past 240 hour(s)).   Time coordinating discharge: 40 minutes  SIGNED:  Noralee Stain, DO [facility] Pager [phone]  If 7PM-7AM, please contact night-coverage [email or web address] Password [facility] 07/14/2017, 12:32 PM

## 2017-07-14 NOTE — Progress Notes (Signed)
Pt will discharge with [facility]/Palliative to follow up at home. [facility] Called.

## 2017-07-14 NOTE — Progress Notes (Signed)
Pt to be discharged back to home with Daughter this afternoon. Daughter given discharge instruction and medication instructions. Daughter verbalized understanding. Discharge Packet with Daughter a time of discharge. Pt's family was wanting to take Pt home via car however Pt unable to safely stand or ambulate even a few steps. Pt now to transport to home via ambulance.

## 2017-07-14 NOTE — Care Management Obs Status (Signed)
MEDICARE OBSERVATION STATUS NOTIFICATION   Patient Details  Name: William Nicholson MRN: [id number] Date of Birth: [birth year]   Medicare Observation Status Notification Given:  Yes    Geni Bers, RN 07/14/2017, 1:39 PM

## 2017-07-14 NOTE — Progress Notes (Signed)
Palliative Medicine RN Note:  Dr Alvino Chapel called requesting help with home palliative care. PMT RN initiated consult and faxed records to [facility].  Margret Chance Alwaleed Obeso, RN, BSN, [facility] 07/14/2017 12:33 PM Cell [phone] 8:00-4:00 Monday-Friday Office [phone]

## 2017-07-14 NOTE — Progress Notes (Signed)
Patient ID: William Nicholson, male   DOB: October 08, 1913, 83 y.o.   MRN: [id number]    Progress Note   Subjective   Daughter at bedside, pt nonverbal- nurse reports no BM's or bleeding since admit Coughing some with clear liquids HGB 11 .0 this am   Objective   Vital signs in last 24 hours: Temp:  [97.4 F (36.3 C)-99 F (37.2 C)] 97.7 F (36.5 C) (09/27 0607) Pulse Rate:  [70-90] 80 (09/27 0607) Resp:  [16-18] 16 (09/27 0607) BP: (122-153)/(80-105) 153/97 (09/27 0607) SpO2:  [99 %-100 %] 100 % (09/27 0607) Weight:  [96 lb 12.5 oz (43.9 kg)] 96 lb 12.5 oz (43.9 kg) (09/26 1810) Last BM Date: 07/13/17 (no stool during the night Pt's family reports BM ) General:    AA male  in NAD, keeps eyes closed Heart:  Regular rate and rhythm; no murmurs Lungs: Respirations even and unlabored, lungs CTA bilaterally Abdomen:  Soft, nontender and nondistended. Normal bowel sounds. Extremities:  Without edema.    Intake/Output from previous day: 09/26 0701 - 09/27 0700 In: 582.5 [I.V.:582.5] Out: -  Intake/Output this shift: No intake/output data recorded.  Lab Results:  Recent Labs  07/13/17 1840 07/13/17 2354 07/14/17 0530  WBC 4.2 3.5* 3.7*  HGB 12.0* 11.6* 11.0*  HCT 33.8* 32.0* 30.8*  PLT 111* 103* 104*   BMET  Recent Labs  07/13/17 1155 07/14/17 0530  NA 134* 135  K 4.1 3.8  CL 104 108  CO2 25 22  GLUCOSE 92 81  BUN 13 11  CREATININE 1.20 0.94  CALCIUM 9.0 8.6*   LFT  Recent Labs  07/14/17 0530  PROT 6.0*  ALBUMIN 2.9*  AST 16  ALT 11*  ALKPHOS 72  BILITOT 0.8   PT/INR  Recent Labs  07/13/17 1155 07/14/17 0530  LABPROT 15.5* 15.9*  INR 1.24 1.29      Assessment / Plan:      #1 83 yo AA male admitted yesterday after bloody BM at home - pt with no prior hx of GI bleeding, no prior GI evaluation . He had been on Eliquis ?diverticular -self limited No active bleeding since admit ,hgb stable  Family OK with permanently stopping Eliquis  Do not  plan any procedures.  Will place on pureed diet   Family to meet with palliative care today , sounds as if 2 of 3 children in favor of hospice /complete comfort care- so pt will not need to be brought back to hospital. Gi will sign off - call for questions/problems     :    Contact  Amy Esterwood, P.A.-C               [phone]      Principal Problem:   Lower GI bleed Active Problems:   Hypertension   BPH (benign prostatic hyperplasia)   Atrial fibrillation (HCC)   Thrombocytopenia (HCC)   Dementia with behavioral disturbance   Anticoagulated     LOS: 1 day   Amy Esterwood  07/14/2017, 11:29 AM

## 2017-07-14 NOTE — Care Management CC44 (Signed)
Condition Code 44 Documentation Completed  Patient Details  Name: William Nicholson MRN: [id number] Date of Birth: Jun 20, 1913   Condition Code 44 given:  Yes Patient signature on Condition Code 44 notice:  Yes Documentation of 2 MD's agreement:  Yes Code 44 added to claim:  Yes    Noelly Lasseigne, RN 07/14/2017, 1:39 PM

## 2017-07-28 DIAGNOSIS — Z23 Encounter for immunization: Secondary | ICD-10-CM | POA: Diagnosis not present

## 2017-07-28 DIAGNOSIS — F0391 Unspecified dementia with behavioral disturbance: Secondary | ICD-10-CM | POA: Diagnosis not present

## 2017-07-28 DIAGNOSIS — F411 Generalized anxiety disorder: Secondary | ICD-10-CM | POA: Diagnosis not present

## 2017-07-28 DIAGNOSIS — I482 Chronic atrial fibrillation: Secondary | ICD-10-CM | POA: Diagnosis not present

## 2017-08-03 ENCOUNTER — Ambulatory Visit: Payer: Medicare HMO | Admitting: Podiatry

## 2017-10-21 ENCOUNTER — Ambulatory Visit: Payer: Medicare HMO | Admitting: Podiatry

## 2017-10-21 ENCOUNTER — Encounter: Payer: Self-pay | Admitting: Podiatry

## 2017-10-21 DIAGNOSIS — I739 Peripheral vascular disease, unspecified: Secondary | ICD-10-CM

## 2017-10-21 DIAGNOSIS — M79676 Pain in unspecified toe(s): Secondary | ICD-10-CM | POA: Diagnosis not present

## 2017-10-21 DIAGNOSIS — L97521 Non-pressure chronic ulcer of other part of left foot limited to breakdown of skin: Secondary | ICD-10-CM

## 2017-10-21 DIAGNOSIS — B351 Tinea unguium: Secondary | ICD-10-CM | POA: Diagnosis not present

## 2017-10-21 NOTE — Progress Notes (Addendum)
Patient ID: William Nicholson, male   DOB: Jul 28, 1913, [age] y.o.   MRN: [id number] HPI  Complaint:  Visit Type: Patient returns to my office for continued preventative foot care services. Complaint: Patient states" my nails have grown long and thick and become painful to walk and wear shoes"  . This patient  presents for preventative foot care services. No changes to ROS.  Patients daughter wants her fathers nails only to be thinned with the dremel tool.  He also has developed a skin lesion noted on the top of the second toe right foot.  His daughter says this is developed over the weekend at her sister's house.  She denies any drainage, but she desires to have this skin lesion evaluated.  Podiatric Exam: Vascular: dorsalis pedis and posterior tibial pulses are non-palpable. Capillary return is diminished. Cold feet noted.. Skin turgor WNL, bilateral swelling  Sensorium: Normal Semmes Weinstein monofilament test. Normal tactile sensation bilaterally.  Nail Exam: Pt has thick disfigured discolored nails with subungual debris noted bilateral entire nail hallux through fifth toenails.  Blackened nail bed right foot hallux.  No redness or drainage. Ulcer Exam: There is no evidence of ulcer . Orthopedic Exam: Muscle tone and strength are WNL. No limitations in general ROM. No crepitus or effusions noted. Foot type and digits show no abnormalities. Bony prominences are unremarkable. Swollen  DIPJ second toe right foot. Skin: No Porokeratosis.  Small skin necrosis noted at the dorsal level of the DIPJ second digit right foot.  No drainage or infection was noted.  Diagnosis:  Onychomycosis, Pain in right toe, pain in left toes.  Skin lesion second digit right foot.  Treatment & Plan Procedures and Treatment: Consent by patient was obtained for treatment procedures. The patient understood the discussion of treatment and procedures well. All questions were answered thoroughly reviewed. Debridement of mycotic  and hypertrophic toenails, 1 through 5 bilateral and clearing of subungual debris. No ulceration, no infection noted.  Used dremel tool only. Examination of the skin lesion reveals healing noted at the site of the blackened skin.  No drainage, but patient was bandaged with Neosporin and a dry sterile dressing.  The family was told to keep an eye on this area and I believe it should continue to heal.  If this condition worsens or becomes very painful, the patient was told to contact this office or go to the Emergency Department at the hospital. Patient is presently under hospice care.  The family will check with the nurse to determine we will provide treatment for his feet at hospice.   Return Visit-Office Procedure: Patient instructed to return to the office for a follow up visit 3 months for continued evaluation and treatment.   Helane GuntherGregory Haig Gerardo DPM

## 2017-10-24 ENCOUNTER — Telehealth: Payer: Self-pay | Admitting: Neurology

## 2017-10-24 ENCOUNTER — Ambulatory Visit: Payer: Medicare HMO | Admitting: Neurology

## 2017-10-24 NOTE — Telephone Encounter (Signed)
Pt's son in law called said he became unresponsive yesterday, Hospice advised the family he was transitioning. All appt's have been c/a  FYI

## 2017-10-24 NOTE — Telephone Encounter (Signed)
Sent to Dr.Ahern for FYI.

## 2017-10-24 NOTE — Telephone Encounter (Signed)
Toma CopierBethany, is thsi the note?

## 2017-10-25 NOTE — Telephone Encounter (Signed)
They called yesterday to let us know.

## 2017-11-18 DEATH — deceased

## 2017-11-27 IMAGING — CR DG CHEST 2V
2 series · 2 of 2 positions shown · non-contrast
Comparison: November 05, 2010

CLINICAL DATA: Weakness and cough

EXAM:
CHEST  2 VIEW

[w chest lat]
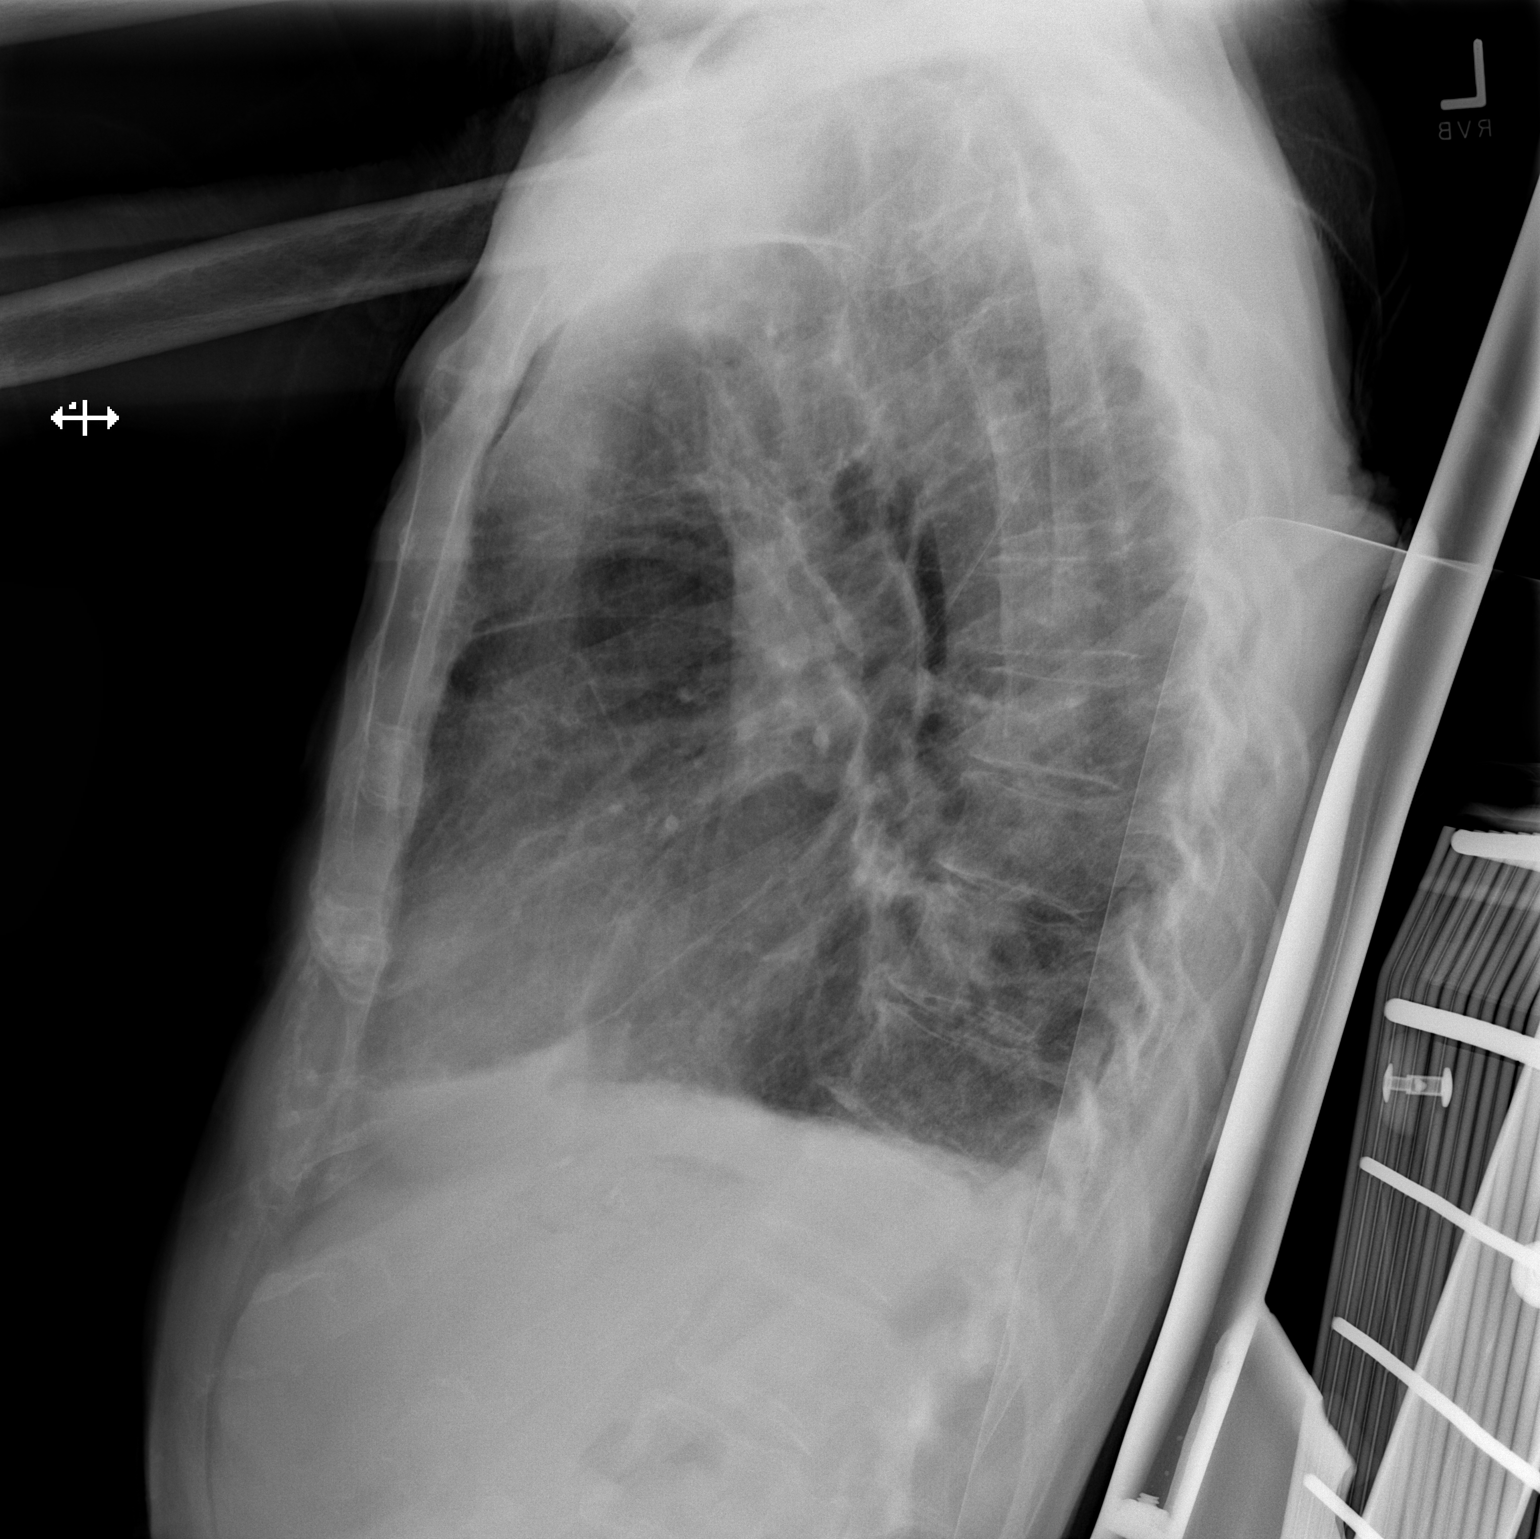

[x chest ap]
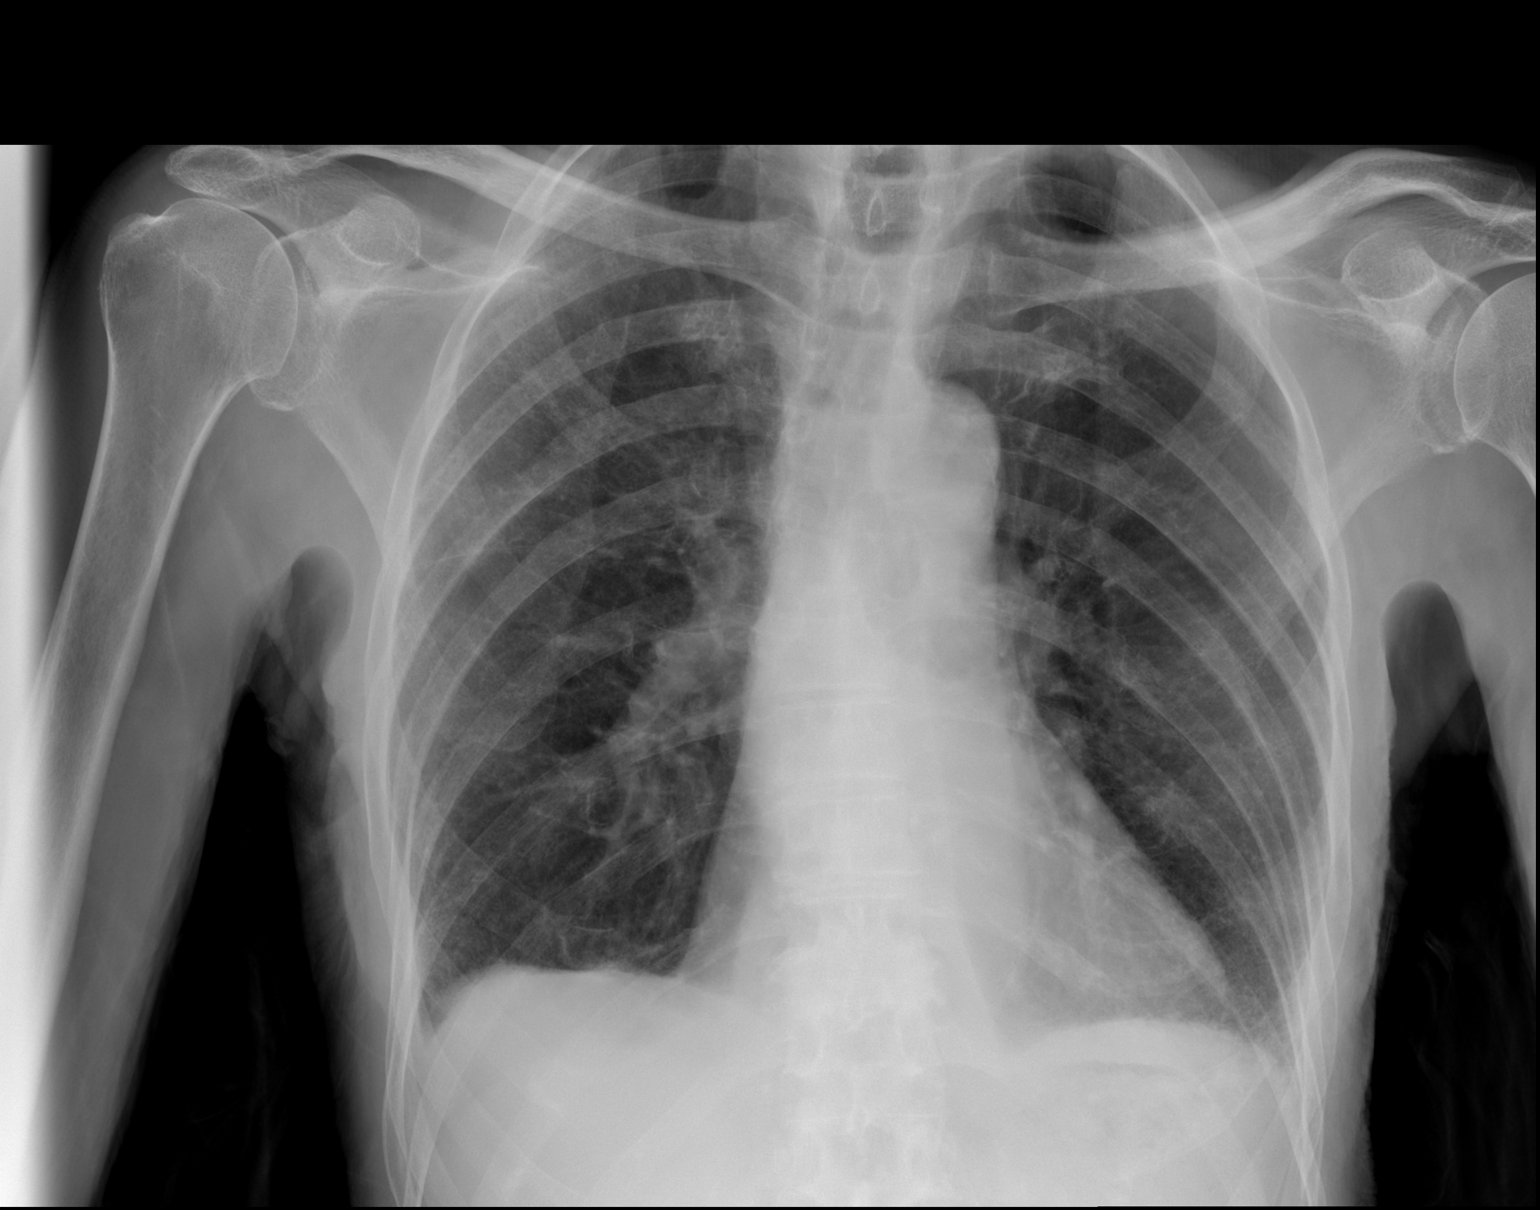

[2 of 2 positions shown; findings below may reference images not displayed]

FINDINGS: There is no appreciable edema or consolidation. There is mild
bilateral apical pleural thickening, stable. Heart size and
pulmonary vascularity are normal. No adenopathy. There is
degenerative change in the thoracic spine.
IMPRESSION: No edema or consolidation.

## 2017-12-29 ENCOUNTER — Ambulatory Visit: Payer: Medicare HMO | Admitting: Neurology

## 2019-03-06 IMAGING — CR DG CHEST 2V
3 series · 3 of 3 positions shown · non-contrast
Comparison: Radiographs 02/06/2017

CLINICAL DATA: [AGE] with weakness.

EXAM:
CHEST  2 VIEW

[x chest ap (1 of 2)]
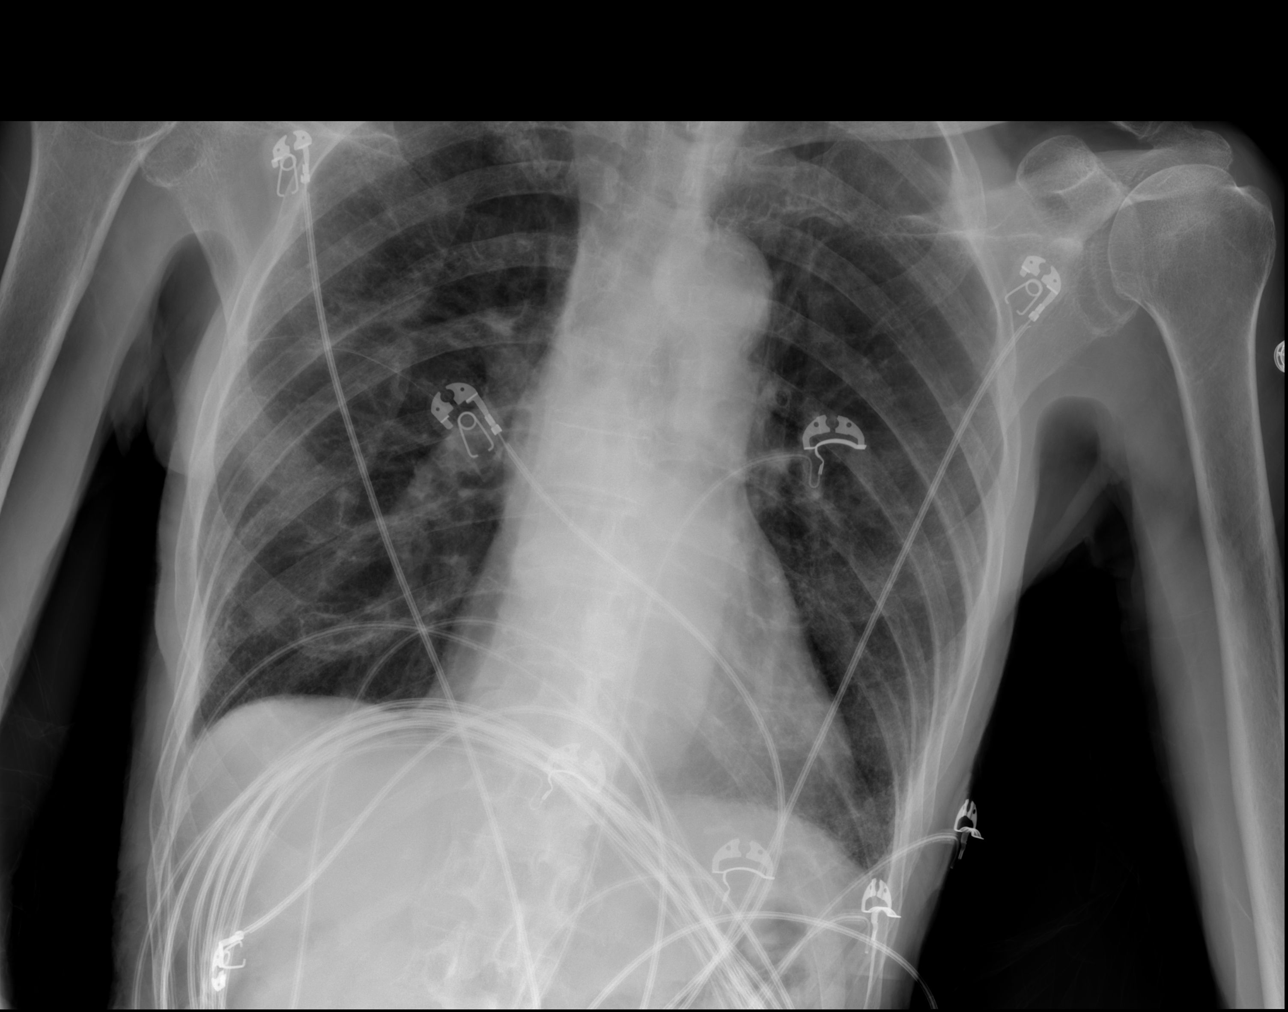

[x chest ap (2 of 2)]
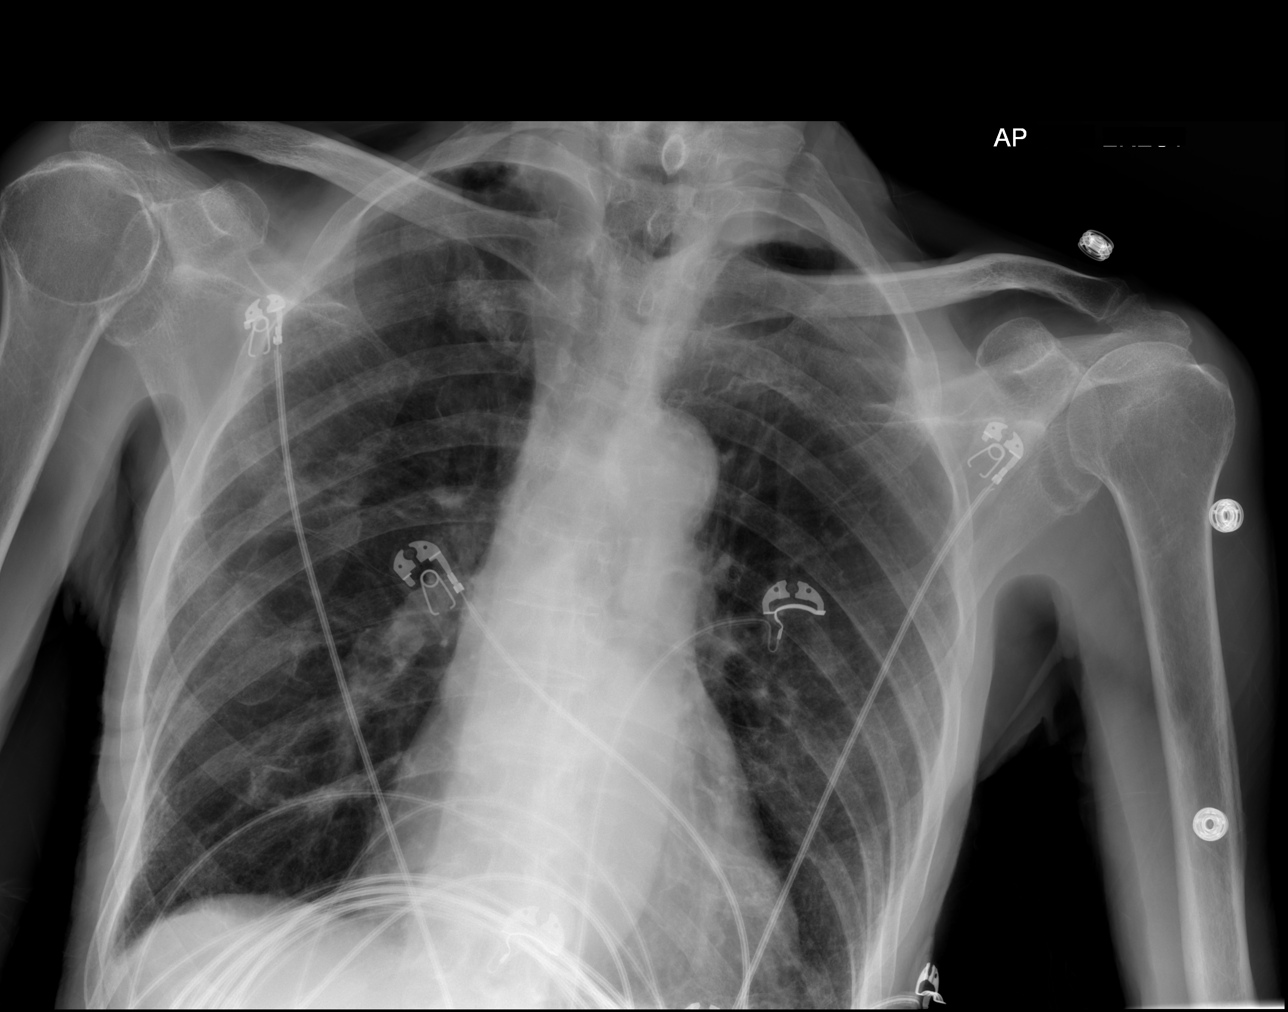

[w chest lat]
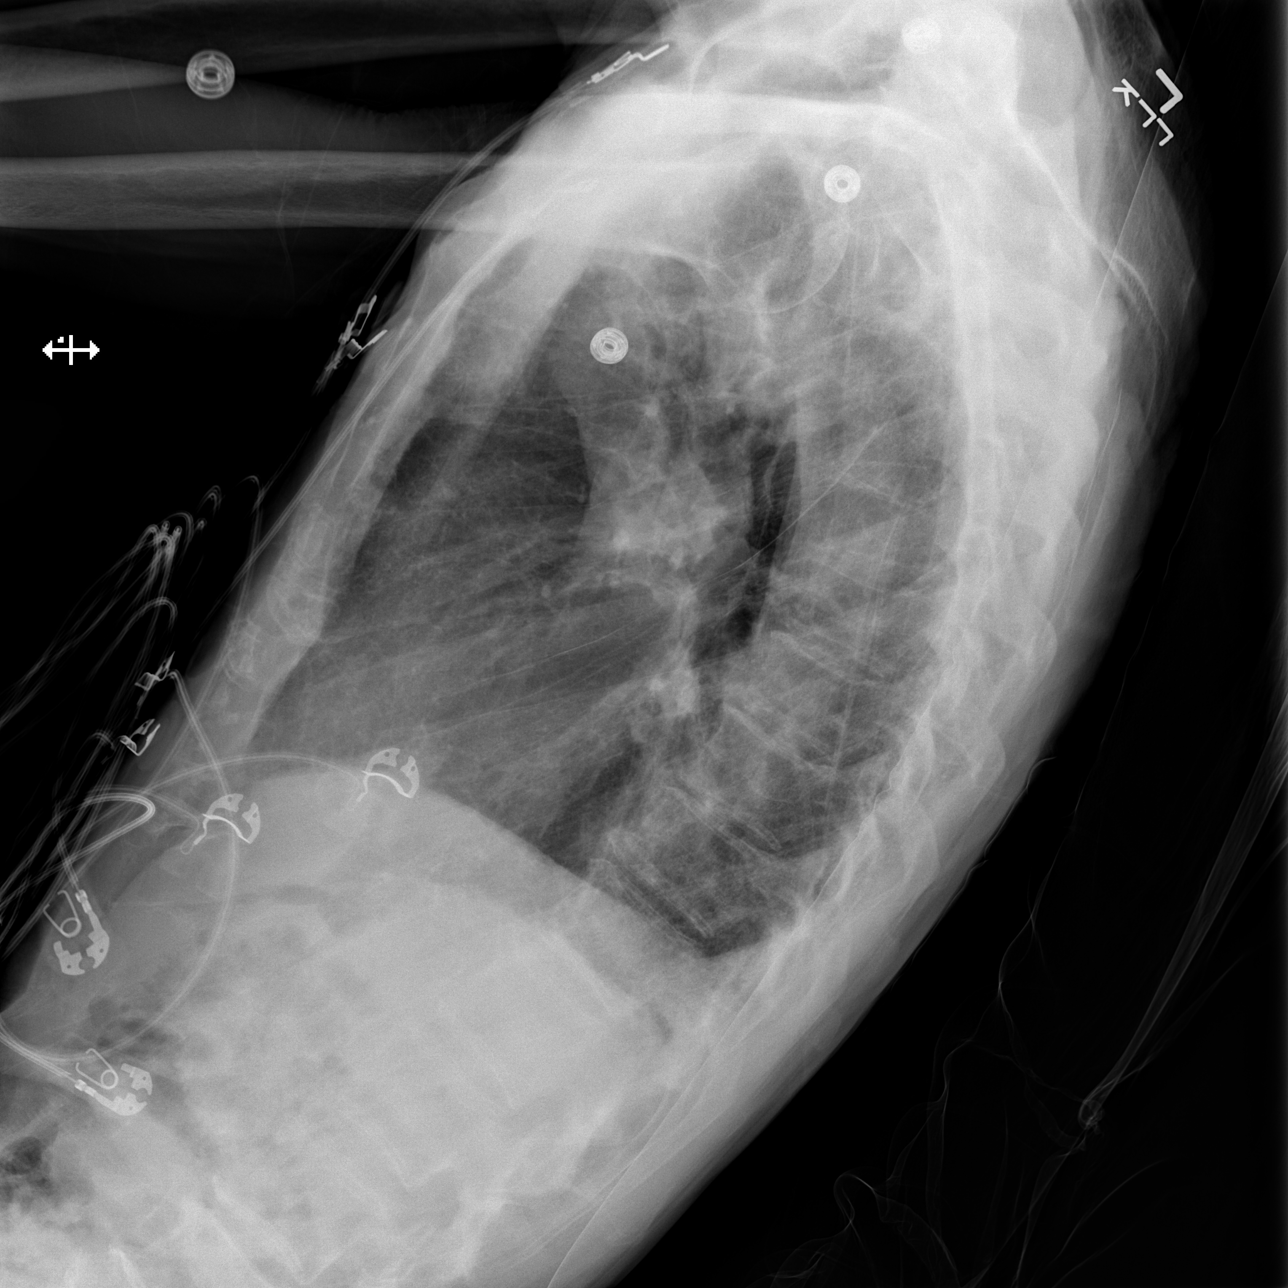

[3 of 3 positions shown; findings below may reference images not displayed]

FINDINGS: Chronic hyperinflation and interstitial coarsening. Decreased
cardiomegaly from prior exam. Mediastinal contours are unchanged. No
pulmonary edema. Possible tiny pleural effusions. No confluent
airspace disease. No pneumothorax. No acute osseous abnormality.
IMPRESSION: 1. Chronic hyperinflation and interstitial coarsening.
2. Possible small pleural effusions.
3. Decreased cardiomegaly from prior.
